# Patient Record
Sex: Female | Born: 1975 | Race: Black or African American | Hispanic: No | Marital: Married | Smoking: Never smoker
Health system: Southern US, Community
[De-identification: ages and names within clinical notes are randomized; demographics above are authoritative.]

## PROBLEM LIST (undated history)

## (undated) DIAGNOSIS — Z789 Other specified health status: Secondary | ICD-10-CM

## (undated) HISTORY — PX: APPENDECTOMY: SHX54

---

## 2002-11-03 ENCOUNTER — Emergency Department (HOSPITAL_COMMUNITY): Admission: EM | Admit: 2002-11-03 | Discharge: 2002-11-03 | Payer: Self-pay | Admitting: Emergency Medicine

## 2002-11-03 ENCOUNTER — Encounter: Payer: Self-pay | Admitting: Emergency Medicine

## 2002-12-25 ENCOUNTER — Other Ambulatory Visit: Admission: RE | Admit: 2002-12-25 | Discharge: 2002-12-25 | Payer: Self-pay | Admitting: Obstetrics and Gynecology

## 2003-09-04 ENCOUNTER — Emergency Department (HOSPITAL_COMMUNITY): Admission: EM | Admit: 2003-09-04 | Discharge: 2003-09-04 | Payer: Self-pay | Admitting: Emergency Medicine

## 2005-02-17 ENCOUNTER — Inpatient Hospital Stay (HOSPITAL_COMMUNITY): Admission: AD | Admit: 2005-02-17 | Discharge: 2005-02-17 | Payer: Self-pay | Admitting: Obstetrics & Gynecology

## 2005-06-01 ENCOUNTER — Other Ambulatory Visit: Admission: RE | Admit: 2005-06-01 | Discharge: 2005-06-01 | Payer: Self-pay | Admitting: Obstetrics and Gynecology

## 2006-09-29 ENCOUNTER — Other Ambulatory Visit: Admission: RE | Admit: 2006-09-29 | Discharge: 2006-09-29 | Payer: Self-pay | Admitting: Obstetrics and Gynecology

## 2006-10-08 ENCOUNTER — Other Ambulatory Visit: Admission: RE | Admit: 2006-10-08 | Discharge: 2006-10-08 | Payer: Self-pay | Admitting: Obstetrics & Gynecology

## 2007-02-25 ENCOUNTER — Emergency Department (HOSPITAL_COMMUNITY): Admission: EM | Admit: 2007-02-25 | Discharge: 2007-02-26 | Payer: Self-pay | Admitting: Emergency Medicine

## 2007-08-02 ENCOUNTER — Emergency Department (HOSPITAL_COMMUNITY): Admission: EM | Admit: 2007-08-02 | Discharge: 2007-08-02 | Payer: Self-pay | Admitting: Family Medicine

## 2007-09-29 ENCOUNTER — Encounter: Admission: RE | Admit: 2007-09-29 | Discharge: 2007-09-29 | Payer: Self-pay | Admitting: Certified Nurse Midwife

## 2007-12-02 ENCOUNTER — Encounter: Admission: RE | Admit: 2007-12-02 | Discharge: 2007-12-06 | Payer: Self-pay | Admitting: Certified Nurse Midwife

## 2010-04-27 ENCOUNTER — Emergency Department (HOSPITAL_BASED_OUTPATIENT_CLINIC_OR_DEPARTMENT_OTHER): Admission: EM | Admit: 2010-04-27 | Discharge: 2010-04-28 | Payer: Self-pay | Admitting: Certified Registered"

## 2010-05-21 ENCOUNTER — Encounter: Admission: RE | Admit: 2010-05-21 | Discharge: 2010-05-21 | Payer: Self-pay | Admitting: Obstetrics and Gynecology

## 2010-11-23 NOTE — L&D Delivery Note (Signed)
Delivery Note At 1:46 PM a viable and healthy female was delivered via Vaginal, Spontaneous Delivery (Presentation: Left Occiput Anterior).  APGAR: , ; weight 6 lb 10 oz (3005 g).   Placenta status: Intact, Spontaneous Pathology for mec.  Cord: 3 vessels with the following complications: None.  Cord pH: none  Anesthesia: Epidural  Episiotomy: None Lacerations: none Suture Repair: none Est. Blood Loss (mL): 150  Mom to postpartum.  Baby to nursery-stable.  Kayci Belleville A 09/28/2011, 2:17 PM

## 2011-02-09 LAB — URINALYSIS, ROUTINE W REFLEX MICROSCOPIC
Bilirubin Urine: NEGATIVE
Glucose, UA: NEGATIVE mg/dL
Hgb urine dipstick: NEGATIVE
Ketones, ur: NEGATIVE mg/dL
Nitrite: NEGATIVE
Protein, ur: NEGATIVE mg/dL
Specific Gravity, Urine: 1.023 (ref 1.005–1.030)
Urobilinogen, UA: 0.2 mg/dL (ref 0.0–1.0)
pH: 8.5 — ABNORMAL HIGH (ref 5.0–8.0)

## 2011-02-09 LAB — URINE MICROSCOPIC-ADD ON

## 2011-02-09 LAB — GLUCOSE, CAPILLARY: Glucose-Capillary: 114 mg/dL — ABNORMAL HIGH (ref 70–99)

## 2011-02-09 LAB — PREGNANCY, URINE: Preg Test, Ur: NEGATIVE

## 2011-09-14 ENCOUNTER — Encounter (HOSPITAL_COMMUNITY): Payer: Self-pay | Admitting: *Deleted

## 2011-09-14 ENCOUNTER — Inpatient Hospital Stay (HOSPITAL_COMMUNITY)
Admission: AD | Admit: 2011-09-14 | Discharge: 2011-09-14 | Disposition: A | Discharge status: Home / Self Care | Payer: 59 | Source: Ambulatory Visit | Attending: Obstetrics & Gynecology | Admitting: Obstetrics & Gynecology

## 2011-09-14 DIAGNOSIS — O479 False labor, unspecified: Secondary | ICD-10-CM | POA: Insufficient documentation

## 2011-09-14 HISTORY — DX: Other specified health status: Z78.9

## 2011-09-14 NOTE — Progress Notes (Signed)
Pt G3 P1, 38.5wks having contractions every 2-50min x 1hr.  Pt denies problems with pregnancy.  No leaking or bleeding.

## 2011-09-28 ENCOUNTER — Inpatient Hospital Stay (HOSPITAL_COMMUNITY): Payer: 59 | Admitting: Anesthesiology

## 2011-09-28 ENCOUNTER — Encounter (HOSPITAL_COMMUNITY): Payer: Self-pay | Admitting: Anesthesiology

## 2011-09-28 ENCOUNTER — Encounter (HOSPITAL_COMMUNITY): Payer: Self-pay | Admitting: *Deleted

## 2011-09-28 ENCOUNTER — Inpatient Hospital Stay (HOSPITAL_COMMUNITY)
Admission: AD | Admit: 2011-09-28 | Discharge: 2011-09-30 | DRG: 775 | Disposition: A | Discharge status: Home / Self Care | Payer: 59 | Source: Ambulatory Visit | Attending: Obstetrics and Gynecology | Admitting: Obstetrics and Gynecology

## 2011-09-28 ENCOUNTER — Other Ambulatory Visit: Payer: Self-pay | Admitting: Obstetrics and Gynecology

## 2011-09-28 DIAGNOSIS — O480 Post-term pregnancy: Principal | ICD-10-CM | POA: Diagnosis present

## 2011-09-28 LAB — CBC
HCT: 34.3 % — ABNORMAL LOW (ref 36.0–46.0)
HCT: 33 % — ABNORMAL LOW (ref 36.0–46.0)
Hemoglobin: 11.2 g/dL — ABNORMAL LOW (ref 12.0–15.0)
Hemoglobin: 10.9 g/dL — ABNORMAL LOW (ref 12.0–15.0)
MCH: 28.6 pg (ref 26.0–34.0)
MCHC: 33 g/dL (ref 30.0–36.0)
MCV: 86.6 fL (ref 78.0–100.0)
RBC: 3.96 MIL/uL (ref 3.87–5.11)
RDW: 15.3 % (ref 11.5–15.5)
RDW: 15.3 % (ref 11.5–15.5)
WBC: 14.5 10*3/uL — ABNORMAL HIGH (ref 4.0–10.5)

## 2011-09-28 LAB — URIC ACID: Uric Acid, Serum: 4.9 mg/dL (ref 2.4–7.0)

## 2011-09-28 LAB — COMPREHENSIVE METABOLIC PANEL
Albumin: 2.3 g/dL — ABNORMAL LOW (ref 3.5–5.2)
BUN: 10 mg/dL (ref 6–23)
Creatinine, Ser: 0.83 mg/dL (ref 0.50–1.10)
GFR calc Af Amer: >90 mL/min (ref >90)
Glucose, Bld: 182 mg/dL — ABNORMAL HIGH (ref 70–99)
Total Protein: 5.4 g/dL — ABNORMAL LOW (ref 6.0–8.3)

## 2011-09-28 LAB — STREP B DNA PROBE: GBS: NEGATIVE

## 2011-09-28 LAB — ANTIBODY SCREEN: Antibody Screen: NEGATIVE

## 2011-09-28 LAB — RPR: RPR: NONREACTIVE

## 2011-09-28 MED ORDER — OXYTOCIN 10 UNIT/ML IJ SOLN: 10.0000 [IU] | Freq: Once | INTRAMUSCULAR | Status: DC

## 2011-09-28 MED ORDER — CITRIC ACID-SODIUM CITRATE 334-500 MG/5ML PO SOLN: 30.0000 mL | ORAL | Status: DC | PRN

## 2011-09-28 MED ORDER — EPHEDRINE 5 MG/ML INJ: 10.0000 mg | INTRAVENOUS | Status: DC | PRN

## 2011-09-28 MED ORDER — NALBUPHINE SYRINGE 5 MG/0.5 ML
10.0000 mg | INJECTION | INTRAMUSCULAR | Status: DC | PRN
  Filled 2011-09-28: qty 1
  Filled 2011-09-28: qty 0.5

## 2011-09-28 MED ORDER — FERROUS SULFATE 325 (65 FE) MG PO TABS
325.0000 mg | ORAL_TABLET | Freq: Two times a day (BID) | ORAL | Status: DC
  Administered 2011-09-28 – 2011-09-30 (×4): 325 mg via ORAL
  Filled 2011-09-28 (×4): qty 1

## 2011-09-28 MED ORDER — FENTANYL 2.5 MCG/ML BUPIVACAINE 1/10 % EPIDURAL INFUSION (WH - ANES)
INTRAMUSCULAR | Status: DC | PRN
  Administered 2011-09-28: 12 mL/h via EPIDURAL

## 2011-09-28 MED ORDER — LIDOCAINE HCL (PF) 1 % IJ SOLN: 30.0000 mL | INTRAMUSCULAR | Status: DC | PRN

## 2011-09-28 MED ORDER — BENZOCAINE-MENTHOL 20-0.5 % EX AERO: 1.0000 "application " | INHALATION_SPRAY | CUTANEOUS | Status: DC | PRN

## 2011-09-28 MED ORDER — IBUPROFEN 600 MG PO TABS: 600.0000 mg | ORAL_TABLET | Freq: Four times a day (QID) | ORAL | Status: DC | PRN

## 2011-09-28 MED ORDER — LACTATED RINGERS IV SOLN: 500.0000 mL | INTRAVENOUS | Status: DC | PRN

## 2011-09-28 MED ORDER — IBUPROFEN 600 MG PO TABS
600.0000 mg | ORAL_TABLET | Freq: Four times a day (QID) | ORAL | Status: DC
  Administered 2011-09-28 – 2011-09-30 (×8): 600 mg via ORAL
  Filled 2011-09-28 (×8): qty 1

## 2011-09-28 MED ORDER — DIBUCAINE 1 % RE OINT: 1.0000 "application " | TOPICAL_OINTMENT | RECTAL | Status: DC | PRN

## 2011-09-28 MED ORDER — LACTATED RINGERS IV SOLN: 500.0000 mL | Freq: Once | INTRAVENOUS | Status: DC

## 2011-09-28 MED ORDER — ONDANSETRON HCL 4 MG/2ML IJ SOLN: 4.0000 mg | INTRAMUSCULAR | Status: DC | PRN

## 2011-09-28 MED ORDER — PHENYLEPHRINE 40 MCG/ML (10ML) SYRINGE FOR IV PUSH (FOR BLOOD PRESSURE SUPPORT)
80.0000 ug | PREFILLED_SYRINGE | INTRAVENOUS | Status: DC | PRN
  Filled 2011-09-28: qty 5

## 2011-09-28 MED ORDER — NALBUPHINE SYRINGE 5 MG/0.5 ML
5.0000 mg | INJECTION | INTRAMUSCULAR | Status: DC | PRN
  Administered 2011-09-28: 5 mg via INTRAVENOUS

## 2011-09-28 MED ORDER — SIMETHICONE 80 MG PO CHEW: 80.0000 mg | CHEWABLE_TABLET | ORAL | Status: DC | PRN

## 2011-09-28 MED ORDER — SODIUM BICARBONATE 8.4 % IV SOLN
INTRAVENOUS | Status: DC | PRN
  Administered 2011-09-28: 4 mL via EPIDURAL

## 2011-09-28 MED ORDER — LACTATED RINGERS IV SOLN
INTRAVENOUS | Status: DC
  Administered 2011-09-28 (×2): via INTRAVENOUS

## 2011-09-28 MED ORDER — ONDANSETRON HCL 4 MG/2ML IJ SOLN: 4.0000 mg | Freq: Four times a day (QID) | INTRAMUSCULAR | Status: DC | PRN

## 2011-09-28 MED ORDER — TERBUTALINE SULFATE 1 MG/ML IJ SOLN: 0.2500 mg | Freq: Once | INTRAMUSCULAR | Status: DC | PRN

## 2011-09-28 MED ORDER — ONDANSETRON HCL 4 MG PO TABS: 4.0000 mg | ORAL_TABLET | ORAL | Status: DC | PRN

## 2011-09-28 MED ORDER — OXYTOCIN 20 UNITS IN LACTATED RINGERS INFUSION - SIMPLE
1.0000 m[IU]/min | INTRAVENOUS | Status: DC
  Administered 2011-09-28: 6 m[IU]/min via INTRAVENOUS
  Administered 2011-09-28: 333 m[IU]/min via INTRAVENOUS
  Administered 2011-09-28: 2 m[IU]/min via INTRAVENOUS
  Filled 2011-09-28: qty 1000

## 2011-09-28 MED ORDER — ACETAMINOPHEN 325 MG PO TABS: 650.0000 mg | ORAL_TABLET | ORAL | Status: DC | PRN

## 2011-09-28 MED ORDER — DIPHENHYDRAMINE HCL 25 MG PO CAPS: 25.0000 mg | ORAL_CAPSULE | Freq: Four times a day (QID) | ORAL | Status: DC | PRN

## 2011-09-28 MED ORDER — WITCH HAZEL-GLYCERIN EX PADS: 1.0000 "application " | MEDICATED_PAD | CUTANEOUS | Status: DC | PRN

## 2011-09-28 MED ORDER — EPHEDRINE 5 MG/ML INJ
10.0000 mg | INTRAVENOUS | Status: DC | PRN
  Filled 2011-09-28: qty 4

## 2011-09-28 MED ORDER — OXYCODONE-ACETAMINOPHEN 5-325 MG PO TABS: 2.0000 | ORAL_TABLET | ORAL | Status: DC | PRN

## 2011-09-28 MED ORDER — SENNOSIDES-DOCUSATE SODIUM 8.6-50 MG PO TABS
2.0000 | ORAL_TABLET | Freq: Every day | ORAL | Status: DC
  Administered 2011-09-28 – 2011-09-29 (×2): 2 via ORAL

## 2011-09-28 MED ORDER — LACTATED RINGERS IV SOLN
INTRAVENOUS | Status: DC
  Administered 2011-09-28: 11:00:00 via INTRAUTERINE

## 2011-09-28 MED ORDER — TETANUS-DIPHTH-ACELL PERTUSSIS 5-2.5-18.5 LF-MCG/0.5 IM SUSP
0.5000 mL | Freq: Once | INTRAMUSCULAR | Status: AC
  Administered 2011-09-29: 0.5 mL via INTRAMUSCULAR
  Filled 2011-09-28: qty 0.5

## 2011-09-28 MED ORDER — FLEET ENEMA 7-19 GM/118ML RE ENEM: 1.0000 | ENEMA | RECTAL | Status: DC | PRN

## 2011-09-28 MED ORDER — LACTATED RINGERS IV SOLN
INTRAVENOUS | Status: DC
  Administered 2011-09-28: 07:00:00 via INTRAVENOUS

## 2011-09-28 MED ORDER — OXYCODONE-ACETAMINOPHEN 5-325 MG PO TABS
1.0000 | ORAL_TABLET | ORAL | Status: DC | PRN
  Administered 2011-09-29: 1 via ORAL
  Administered 2011-09-30: 2 via ORAL
  Filled 2011-09-28 (×2): qty 1

## 2011-09-28 MED ORDER — LIDOCAINE HCL 1.5 % IJ SOLN
INTRAMUSCULAR | Status: DC | PRN
  Administered 2011-09-28: 4 mL via EPIDURAL
  Administered 2011-09-28: 33 mL via EPIDURAL

## 2011-09-28 MED ORDER — PRENATAL PLUS 27-1 MG PO TABS
1.0000 | ORAL_TABLET | Freq: Every day | ORAL | Status: DC
  Administered 2011-09-28 – 2011-09-30 (×3): 1 via ORAL
  Filled 2011-09-28 (×3): qty 1

## 2011-09-28 MED ORDER — DIPHENHYDRAMINE HCL 50 MG/ML IJ SOLN: 12.5000 mg | INTRAMUSCULAR | Status: DC | PRN

## 2011-09-28 MED ORDER — OXYTOCIN 20 UNITS IN LACTATED RINGERS INFUSION - SIMPLE: 125.0000 mL/h | Freq: Once | INTRAVENOUS | Status: DC

## 2011-09-28 MED ORDER — OXYTOCIN BOLUS FROM INFUSION
500.0000 mL | Freq: Once | INTRAVENOUS | Status: DC
  Filled 2011-09-28: qty 500

## 2011-09-28 MED ORDER — FENTANYL 2.5 MCG/ML BUPIVACAINE 1/10 % EPIDURAL INFUSION (WH - ANES)
14.0000 mL/h | INTRAMUSCULAR | Status: DC
  Filled 2011-09-28: qty 60

## 2011-09-28 MED ORDER — LANOLIN HYDROUS EX OINT: TOPICAL_OINTMENT | CUTANEOUS | Status: DC | PRN

## 2011-09-28 MED ORDER — ZOLPIDEM TARTRATE 5 MG PO TABS: 5.0000 mg | ORAL_TABLET | Freq: Every evening | ORAL | Status: DC | PRN

## 2011-09-28 MED ORDER — PHENYLEPHRINE 40 MCG/ML (10ML) SYRINGE FOR IV PUSH (FOR BLOOD PRESSURE SUPPORT): 80.0000 ug | PREFILLED_SYRINGE | INTRAVENOUS | Status: DC | PRN

## 2011-09-28 NOTE — Progress Notes (Signed)
Kristen Gonzalez is a 35 y.o. G3P0011 at [redacted]w[redacted]d by ultrasound admitted for active labor  Subjective: Chief Complaint  Patient presents with  . Contractions    Objective: BP 109/54  Pulse 70  Temp(Src) 97.5 F (36.4 C) (Oral)  Resp 18  Ht 4\' 11"  (1.499 m)  Wt 73.483 kg (162 lb)  BMI 32.72 kg/m2     Pitocin 2 MIU  FHT:  tracingvariable decels UC:   regular, every 2-3 minutes SVE:   5/70/-3 SROM med mec fluid Tracing: variable decels, baseline 110-115 min variability ctx q 2- 3 mins  Labs: Lab Results  Component Value Date   WBC 14.5* 09/28/2011   HGB 11.2* 09/28/2011   HCT 34.3* 09/28/2011   MCV 86.6 09/28/2011   PLT 180 09/28/2011    Assessment / Plan: Augmentation of labor, progressing well  P0 Epidural. Amnioinfusion cont pitocin Anticipated MOD:  NSVD  Kristen Gonzalez A 09/28/2011, 10:32 AM

## 2011-09-28 NOTE — Progress Notes (Signed)
Pt reports painful uc's since 03:30. Pt reports vaginal bleeding at 06:00.

## 2011-09-28 NOTE — Progress Notes (Signed)
S: Called for delivery w/ FHR deceleration.     O: on arrival, mat O2  In place. FHR back to baseline of 110-115. FHR had decreased to 60's   X 5 mins with / variables.    VE:  Fully dilated/ +2 station  Thick mec despite amnioinfusion  IMP: Complete Postterm P) Pedi in attendance 2) start pushing

## 2011-09-28 NOTE — Anesthesia Preprocedure Evaluation (Signed)
Anesthesia Evaluation  Patient identified by MRN, date of birth, ID band Patient awake    Reviewed: Allergy & Precautions, H&P , Patient's Chart, lab work & pertinent test results  Airway Mallampati: III TM Distance: >3 FB Neck ROM: full    Dental No notable dental hx. (+) Teeth Intact   Pulmonary neg pulmonary ROS,  clear to auscultation  Pulmonary exam normal       Cardiovascular neg cardio ROS regular Normal    Neuro/Psych Negative Neurological ROS  Negative Psych ROS   GI/Hepatic negative GI ROS, Neg liver ROS,   Endo/Other  Negative Endocrine ROS  Renal/GU negative Renal ROS  Genitourinary negative   Musculoskeletal   Abdominal   Peds  Hematology negative hematology ROS (+)   Anesthesia Other Findings   Reproductive/Obstetrics (+) Pregnancy                           Anesthesia Physical Anesthesia Plan  ASA: II  Anesthesia Plan: Epidural   Post-op Pain Management:    Induction:   Airway Management Planned:   Additional Equipment:   Intra-op Plan:   Post-operative Plan:   Informed Consent: I have reviewed the patients History and Physical, chart, labs and discussed the procedure including the risks, benefits and alternatives for the proposed anesthesia with the patient or authorized representative who has indicated his/her understanding and acceptance.     Plan Discussed with: Anesthesiologist and Surgeon  Anesthesia Plan Comments:         Anesthesia Quick Evaluation  

## 2011-09-28 NOTE — Anesthesia Procedure Notes (Signed)
Epidural Patient location during procedure: OB Start time: 09/28/2011 11:17 AM  Staffing Anesthesiologist: Leshawn Straka A. Performed by: anesthesiologist   Preanesthetic Checklist Completed: patient identified, site marked, surgical consent, pre-op evaluation, timeout performed, IV checked, risks and benefits discussed and monitors and equipment checked  Epidural Patient position: sitting Prep: site prepped and draped and DuraPrep Patient monitoring: continuous pulse ox and blood pressure Approach: midline Injection technique: LOR air  Needle:  Needle type: Tuohy  Needle gauge: 17 G Needle length: 9 cm Needle insertion depth: 7 cm Catheter type: closed end flexible Catheter size: 19 Gauge Catheter at skin depth: 12 cm Test dose: negative and 1.5% lidocaine  Assessment Events: blood not aspirated, injection not painful, no injection resistance, negative IV test and no paresthesia  Additional Notes Patient is more comfortable after epidural dosed. Please see RN's note for documentation of vital signs and FHR which are stable.

## 2011-09-28 NOTE — H&P (Signed)
Alvis Mort Sawyers is a 35 y.o. female presenting for active labor History OB History    Grav Para Term Preterm Abortions TAB SAB Ect Mult Living   3 1   1  1   1      Past Medical History  Diagnosis Date  . No pertinent past medical history    Past Surgical History  Procedure Date  . Appendectomy    Family History: family history includes Diabetes in her mother and Hypertension in her mother. Social History:  reports that she has never smoked. She has never used smokeless tobacco. She reports that she does not drink alcohol or use illicit drugs.  ROS neg PE: Uncomfortable gravid female Skin: warm dry Heent: anicteric sclera, pink conjunctiva, oropharynx neg Cor: RRR w/o m Lungs: clear to A Abd gravid Ext 1+ edema Dilation: 4.5 Effacement (%): 70 Station: -2 Exam by:: B Mosca Blood pressure 112/67, pulse 74, temperature 98.2 F (36.8 C), temperature source Oral, resp. rate 20. Exam Physical Exam  Prenatal labs: ABO, Rh:  A positive Antibody:  neg Rubella:  Immune RPR:   NR HBsAg:   neg HIV:   neg GBS:   neg  Assessment/Plan: Active labor P) admit routine labs, amniotomy prn, epidural prn, analgesic prn   Angie Hogg A 09/28/2011, 7:21 AM

## 2011-09-29 LAB — CBC
HCT: 29.5 % — ABNORMAL LOW (ref 36.0–46.0)
Hemoglobin: 9.9 g/dL — ABNORMAL LOW (ref 12.0–15.0)
MCH: 29.1 pg (ref 26.0–34.0)
MCV: 86.8 fL (ref 78.0–100.0)
RBC: 3.4 MIL/uL — ABNORMAL LOW (ref 3.87–5.11)
WBC: 20.2 10*3/uL — ABNORMAL HIGH (ref 4.0–10.5)

## 2011-09-29 MED ORDER — POLYSACCHARIDE IRON 150 MG PO CAPS
150.0000 mg | ORAL_CAPSULE | Freq: Every day | ORAL | Status: DC
Start: 2011-09-29 — End: 2011-09-30
  Administered 2011-09-29: 150 mg via ORAL
  Filled 2011-09-29 (×3): qty 1

## 2011-09-29 NOTE — Anesthesia Postprocedure Evaluation (Signed)
  Anesthesia Post-op Note  Patient: [employer]  Procedure(s) Performed: * No procedures listed *  Patient Location: Mother/Baby  Anesthesia Type: Epidural  Level of Consciousness: awake, alert  and oriented  Airway and Oxygen Therapy: Patient Spontanous Breathing  Post-op Pain: none  Post-op Assessment: Post-op Vital signs reviewed, Patient's Cardiovascular Status Stable, Respiratory Function Stable, Patent Airway, No signs of Nausea or vomiting, Adequate PO intake and Pain level controlled  Post-op Vital Signs: Reviewed and stable  Complications: No apparent anesthesia complications

## 2011-09-29 NOTE — Progress Notes (Signed)
  PPD # 1  Subjective: Pt reports feeling well/ Pain controlled with prescription NSAID's including motrin Tolerating po/ Voiding without problems/ No n/v Bleeding is light Newborn info:  Information for the patient's newborn:  Jozalyn, Baglio [[id number]]  female  / circ complete per Dr [name]/ Feeding: bottle    Objective:  VS: Blood pressure 99/54, pulse 88, temperature 97.7 F (36.5 C), temperature source Oral, resp. rate 18, height 4\' 11"  (1.499 m), weight 73.483 kg (162 lb), SpO2 98.00%, unknown if currently breastfeeding.    Basename 09/29/11 0530 09/28/11 1814  WBC 20.2* 21.1*  HGB 9.9* 10.9*  HCT 29.5* 33.0*  PLT 164 188    Blood type: A/Positive/-- (11/05 0000) Rubella: Immune (11/05 0000)    Physical Exam:  General: alert, cooperative and no distress CV: Regular rate and rhythm Resp: clear Abdomen: soft, nontender, normal bowel sounds Uterine Fundus: firm, below umbilicus, nontender Perineum: is normal Lochia: minimal Ext: Homans sign is negative, no sign of DVT and no edema, redness or tenderness in the calves or thighs   A/P: PPD # 1/ [id number] S/P SVD Mild ABL anemia, will add iron supplement after 1st BM Doing well Continue routine post partum orders Anticipate discharge home in AM

## 2011-09-30 MED ORDER — OXYCODONE-ACETAMINOPHEN 5-325 MG PO TABS: 1.0000 | ORAL_TABLET | ORAL | Status: AC | PRN

## 2011-09-30 MED ORDER — FERROUS SULFATE 325 (65 FE) MG PO TABS: 325.0000 mg | ORAL_TABLET | Freq: Two times a day (BID) | ORAL | Status: DC

## 2011-09-30 MED ORDER — IBUPROFEN 600 MG PO TABS: 600.0000 mg | ORAL_TABLET | Freq: Four times a day (QID) | ORAL | Status: AC

## 2011-09-30 NOTE — Progress Notes (Signed)
  PPD 2 SVD  S:  Reports feeling well /just tired             Tolerating po/ No nausea or vomiting             Bleeding is moderate             Pain controlled withprescription NSAID's including motrin and percocet             Up ad lib / ambulatory  Newborn breast feeding  / Circumcision requested   O:  A & O x 3              VS: Blood pressure 100/59, pulse 85, temperature 98.2 F (36.8 C), temperature source Oral, resp. rate 16, height 4\' 11"  (1.499 m), weight 73.483 kg (162 lb), SpO2 98.00%, unknown if currently breastfeeding.  LABS: Lab Results  Component Value Date   WBC 20.2* 09/29/2011   HGB 9.9* 09/29/2011   HCT 29.5* 09/29/2011   MCV 86.8 09/29/2011   PLT 164 09/29/2011     Lungs: Clear and unlabored  Heart: regular rate and rhythm / no mumurs  Abdomen: soft, non-tender, non-distended              Fundus: firm, non-tender, Ueven  Perineum: mild edema - ice pack in place  Lochia: light  Extremities: trace edema, no calf pain or tenderness    A: PPD # 2   Doing well - stable status  P:  Routine post partum orders  Discharge to home  [facility] 09/30/2011, 11:16 AM

## 2011-09-30 NOTE — Discharge Summary (Signed)
Obstetric Discharge Summary Reason for Admission: onset of labor Prenatal Procedures: none Intrapartum Procedures: spontaneous vaginal delivery Postpartum Procedures: none Complications-Operative and Postpartum: none Hemoglobin  Date Value Range Status  09/29/2011 9.9* 12.0-15.0 (g/dL) Final     HCT  Date Value Range Status  09/29/2011 29.5* 36.0-46.0 (%) Final    Discharge Diagnoses: Term Pregnancy-delivered  Discharge Information: Date: 09/30/2011 Activity: pelvic rest Diet: routine Medications: PNV, Ibuprofen, Colace, Iron and Percocet Condition: stable Instructions: refer to practice specific booklet Discharge to: home Follow-up Information    Follow up with [name],[name], MD. Make an appointment in 6 weeks.   Contact information:   [address] [place] [address] [phone]          Newborn Data: Live born female  Birth Weight: 6 lb 10 oz (3005 g) APGAR: 8, 9  Home with mother.  Kristen Gonzalez 09/30/2011, 11:21 AM

## 2012-05-24 ENCOUNTER — Emergency Department (HOSPITAL_COMMUNITY)
Admission: EM | Admit: 2012-05-24 | Discharge: 2012-05-25 | Disposition: A | Discharge status: Home / Self Care | Payer: No Typology Code available for payment source | Attending: Emergency Medicine | Admitting: Emergency Medicine

## 2012-05-24 ENCOUNTER — Encounter (HOSPITAL_COMMUNITY): Payer: Self-pay | Admitting: Radiology

## 2012-05-24 ENCOUNTER — Emergency Department (HOSPITAL_COMMUNITY): Payer: No Typology Code available for payment source

## 2012-05-24 DIAGNOSIS — M25569 Pain in unspecified knee: Secondary | ICD-10-CM | POA: Insufficient documentation

## 2012-05-24 DIAGNOSIS — M545 Low back pain, unspecified: Secondary | ICD-10-CM | POA: Insufficient documentation

## 2012-05-24 DIAGNOSIS — M546 Pain in thoracic spine: Secondary | ICD-10-CM | POA: Insufficient documentation

## 2012-05-24 DIAGNOSIS — IMO0001 Reserved for inherently not codable concepts without codable children: Secondary | ICD-10-CM | POA: Insufficient documentation

## 2012-05-24 DIAGNOSIS — R079 Chest pain, unspecified: Secondary | ICD-10-CM | POA: Insufficient documentation

## 2012-05-24 DIAGNOSIS — M542 Cervicalgia: Secondary | ICD-10-CM | POA: Insufficient documentation

## 2012-05-24 LAB — URINALYSIS, ROUTINE W REFLEX MICROSCOPIC
Ketones, ur: NEGATIVE mg/dL
Nitrite: NEGATIVE
Protein, ur: 30 mg/dL — AB
Urobilinogen, UA: 0.2 mg/dL (ref 0.0–1.0)

## 2012-05-24 LAB — POCT I-STAT, CHEM 8
BUN: 14 mg/dL (ref 6–23)
Calcium, Ion: 1.19 mmol/L (ref 1.12–1.32)
Hemoglobin: 14.6 g/dL (ref 12.0–15.0)
Sodium: 140 mEq/L (ref 135–145)
TCO2: 25 mmol/L (ref 0–100)

## 2012-05-24 LAB — PREGNANCY, URINE: Preg Test, Ur: NEGATIVE

## 2012-05-24 LAB — URINE MICROSCOPIC-ADD ON

## 2012-05-24 MED ORDER — IBUPROFEN 800 MG PO TABS: 800.0000 mg | ORAL_TABLET | Freq: Three times a day (TID) | ORAL | Status: AC

## 2012-05-24 MED ORDER — DIAZEPAM 5 MG PO TABS: 5.0000 mg | ORAL_TABLET | Freq: Three times a day (TID) | ORAL | Status: AC | PRN

## 2012-05-24 MED ORDER — DIAZEPAM 5 MG PO TABS
5.0000 mg | ORAL_TABLET | Freq: Once | ORAL | Status: AC
  Administered 2012-05-24: 5 mg via ORAL
  Filled 2012-05-24: qty 1

## 2012-05-24 MED ORDER — OXYCODONE-ACETAMINOPHEN 5-325 MG PO TABS
1.0000 | ORAL_TABLET | Freq: Once | ORAL | Status: AC
  Administered 2012-05-24: 1 via ORAL
  Filled 2012-05-24: qty 1

## 2012-05-24 MED ORDER — OXYCODONE-ACETAMINOPHEN 5-325 MG PO TABS: ORAL_TABLET | ORAL | Status: DC

## 2012-05-24 MED ORDER — IBUPROFEN 200 MG PO TABS
600.0000 mg | ORAL_TABLET | Freq: Once | ORAL | Status: AC
  Administered 2012-05-24: 600 mg via ORAL
  Filled 2012-05-24: qty 3
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
## 2012-05-24 NOTE — ED Notes (Signed)
Pt ambulated with a steady gait; VSS; A&Ox3; no signs of distress; respirations even and unlabored; skin warm and dry. No questions at time.
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
## 2012-05-25 ENCOUNTER — Encounter (HOSPITAL_COMMUNITY): Payer: Self-pay

## 2012-05-25 ENCOUNTER — Emergency Department (HOSPITAL_COMMUNITY)
Admission: EM | Admit: 2012-05-25 | Discharge: 2012-05-25 | Disposition: A | Discharge status: Home / Self Care | Payer: No Typology Code available for payment source | Attending: Emergency Medicine | Admitting: Emergency Medicine

## 2012-05-25 DIAGNOSIS — M7918 Myalgia, other site: Secondary | ICD-10-CM

## 2012-05-25 DIAGNOSIS — Z043 Encounter for examination and observation following other accident: Secondary | ICD-10-CM | POA: Insufficient documentation

## 2012-05-25 DIAGNOSIS — IMO0001 Reserved for inherently not codable concepts without codable children: Secondary | ICD-10-CM | POA: Insufficient documentation

## 2012-05-25 MED ORDER — DIAZEPAM 5 MG/ML IJ SOLN
2.5000 mg | Freq: Once | INTRAMUSCULAR | Status: AC
  Administered 2012-05-25: 2.5 mg via INTRAMUSCULAR
  Filled 2012-05-25: qty 2

## 2012-05-25 MED ORDER — MORPHINE SULFATE 4 MG/ML IJ SOLN
4.0000 mg | Freq: Once | INTRAMUSCULAR | Status: AC
  Administered 2012-05-25: 4 mg via INTRAMUSCULAR
  Filled 2012-05-25: qty 1
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
## 2012-05-25 NOTE — Progress Notes (Signed)
Orthopedic Tech Progress Note Patient Details:  Kristen Gonzalez [facility] 1976/07/12 [id number]  Ortho Devices Type of Ortho Device: Crutches Ortho Device/Splint Interventions: Application   [name] T 05/25/2012, 4:08 PM

## 2012-05-25 NOTE — ED Provider Notes (Signed)
Medical screening examination/treatment/procedure(s) were performed by non-physician practitioner and as supervising physician I was immediately available for consultation/collaboration.  Juliet Rude. Rubin Payor, MD 05/25/12 1610

## 2012-05-25 NOTE — ED Notes (Signed)
Ortho at bedside.
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
## 2012-05-25 NOTE — Discharge Instructions (Signed)
Take your previously prescribed pain medication. Follow up with your primary care doctor as discussed.  RESOURCE GUIDE  Dental Problems  Patients with Medicaid: [facility] [phone]                                           [phone] [address] [employer] Phone:  [phone]                                                   Phone:  [phone]  If unable to pay or uninsured, contact:  [facility] or [facility]. to become qualified for the adult dental clinic.  Chronic Pain Problems Contact [facility]  [phone] Patients need to be referred by their primary care doctor.  Insufficient Money for Medicine Contact [employer]:  call "211" or [facility] [phone].  No Primary Care Doctor Call [facility]  [phone] Other agencies that provide inexpensive medical care    [facility]  [phone]    [facility]  [phone]    [facility]  [phone]    [facility]  [phone]    [facility]  [phone]    [facility]  [phone]  Psychological Services [facility] Behavioral [facility]  [phone] [facility]  [phone] [facility]   [phone] (emergency services [phone])  Abuse/Neglect [facility] Child Abuse Hotline [phone] [facility] Child Abuse Hotline 716-082-4039 (After Hours)  Emergency Shelter [facility] [phone]  M[facility] [place] [facility] [place] [phone] [facility] [phone]  MRSA Hotline #:   [phone]    [facility] Resources  [facility]  [employer]                           [facility]. [address] [place]                     [address]         [address]  Blondell Reveal Phone:  [phone]                                  Phone:  [phone]                   Phone:  [phone]  [facility] Mental Health Phone:  [phone]  [facility] Child Abuse Hotline [phone] (After  Hours)
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
## 2014-05-15 ENCOUNTER — Encounter (HOSPITAL_BASED_OUTPATIENT_CLINIC_OR_DEPARTMENT_OTHER): Payer: Self-pay | Admitting: Emergency Medicine

## 2014-05-15 ENCOUNTER — Emergency Department (HOSPITAL_BASED_OUTPATIENT_CLINIC_OR_DEPARTMENT_OTHER)
Admission: EM | Admit: 2014-05-15 | Discharge: 2014-05-16 | Disposition: A | Discharge status: Home / Self Care | Payer: 59 | Attending: Emergency Medicine | Admitting: Emergency Medicine

## 2014-05-15 ENCOUNTER — Emergency Department (HOSPITAL_BASED_OUTPATIENT_CLINIC_OR_DEPARTMENT_OTHER): Payer: 59

## 2014-05-15 DIAGNOSIS — R209 Unspecified disturbances of skin sensation: Secondary | ICD-10-CM | POA: Insufficient documentation

## 2014-05-15 DIAGNOSIS — J189 Pneumonia, unspecified organism: Secondary | ICD-10-CM

## 2014-05-15 DIAGNOSIS — R000 Tachycardia, unspecified: Secondary | ICD-10-CM | POA: Insufficient documentation

## 2014-05-15 DIAGNOSIS — Z792 Long term (current) use of antibiotics: Secondary | ICD-10-CM | POA: Insufficient documentation

## 2014-05-15 DIAGNOSIS — J159 Unspecified bacterial pneumonia: Secondary | ICD-10-CM | POA: Insufficient documentation

## 2014-05-15 LAB — CBC WITH DIFFERENTIAL/PLATELET
BASOS ABS: 0 10*3/uL (ref 0.0–0.1)
BASOS PCT: 0 % (ref 0–1)
EOS ABS: 0.2 10*3/uL (ref 0.0–0.7)
EOS PCT: 1 % (ref 0–5)
HCT: 42.3 % (ref 36.0–46.0)
Hemoglobin: 14.2 g/dL (ref 12.0–15.0)
Lymphocytes Relative: 13 % (ref 12–46)
Lymphs Abs: 2.4 10*3/uL (ref 0.7–4.0)
MCH: 29.5 pg (ref 26.0–34.0)
MCHC: 33.6 g/dL (ref 30.0–36.0)
MCV: 87.8 fL (ref 78.0–100.0)
Monocytes Absolute: 1.5 10*3/uL — ABNORMAL HIGH (ref 0.1–1.0)
Monocytes Relative: 8 % (ref 3–12)
Neutro Abs: 13.8 10*3/uL — ABNORMAL HIGH (ref 1.7–7.7)
Neutrophils Relative %: 77 % (ref 43–77)
PLATELETS: 245 10*3/uL (ref 150–400)
RBC: 4.82 MIL/uL (ref 3.87–5.11)
RDW: 13.8 % (ref 11.5–15.5)
WBC: 17.9 10*3/uL — ABNORMAL HIGH (ref 4.0–10.5)

## 2014-05-15 LAB — COMPREHENSIVE METABOLIC PANEL
ALBUMIN: 4.4 g/dL (ref 3.5–5.2)
ALT: 19 U/L (ref 0–35)
AST: 19 U/L (ref 0–37)
Alkaline Phosphatase: 64 U/L (ref 39–117)
BUN: 8 mg/dL (ref 6–23)
CALCIUM: 9.5 mg/dL (ref 8.4–10.5)
CO2: 25 mEq/L (ref 19–32)
Chloride: 98 mEq/L (ref 96–112)
Creatinine, Ser: 0.9 mg/dL (ref 0.50–1.10)
GFR calc non Af Amer: 81 mL/min — ABNORMAL LOW (ref >90)
Glucose, Bld: 108 mg/dL — ABNORMAL HIGH (ref 70–99)
Potassium: 4.1 mEq/L (ref 3.7–5.3)
SODIUM: 137 meq/L (ref 137–147)
TOTAL PROTEIN: 7.9 g/dL (ref 6.0–8.3)
Total Bilirubin: 0.4 mg/dL (ref 0.3–1.2)

## 2014-05-15 MED ORDER — ACETAMINOPHEN 325 MG PO TABS
650.0000 mg | ORAL_TABLET | Freq: Once | ORAL | Status: AC
  Administered 2014-05-15: 650 mg via ORAL

## 2014-05-15 MED ORDER — LEVOFLOXACIN IN D5W 500 MG/100ML IV SOLN
500.0000 mg | Freq: Once | INTRAVENOUS | Status: AC
  Administered 2014-05-15: 500 mg via INTRAVENOUS
  Filled 2014-05-15: qty 100

## 2014-05-15 MED ORDER — SODIUM CHLORIDE 0.9 % IV BOLUS (SEPSIS)
1000.0000 mL | Freq: Once | INTRAVENOUS | Status: AC
  Administered 2014-05-15: 1000 mL via INTRAVENOUS

## 2014-05-15 MED ORDER — PROMETHAZINE-DM 6.25-15 MG/5ML PO SYRP: 2.5000 mL | ORAL_SOLUTION | Freq: Four times a day (QID) | ORAL | Status: DC | PRN

## 2014-05-15 MED ORDER — LEVOFLOXACIN 500 MG PO TABS: 500.0000 mg | ORAL_TABLET | Freq: Every day | ORAL | Status: AC

## 2014-05-15 NOTE — ED Notes (Addendum)
Fever. Chest pain and sob x 5 hours. Upper respiratory congestion. She is speaking in complete sentences. She states she had 2 teeth pulled on Friday. She has been taking Amoxicillin since that time.

## 2014-05-15 NOTE — ED Provider Notes (Signed)
CSN: [id number]     Arrival date & time 05/15/14  1906 History  This chart was scribed for Gerhard Munchobert Lockwood, MD by Phillis HaggisGabriella Gaje, ED Scribe. This patient was seen in room MH01/MH01 and patient care was started at 7:48 PM.   Chief Complaint  Patient presents with  . Shortness of Breath   The history is provided by the patient. No language interpreter was used.  HPI Comments: Kristen Gonzalez is a 38 y.o. female who presents to the Emergency Department complaining of gradual onset, constant, mild to moderate chest pain onset 5 hours ago. There is associated chills, SOB and a mild cough. She also reports associated numbness in upper and lower extremities. She reports a new onset of nasal congestion and states that her son was recently diagnosed with pneumonia. She had two teeth pulled 4 days ago and has been taking amoxicillin and ibuprofen. She denies a history of similar problems and any other medical history. She reports that she does not smoke or drink, has not engaged in any new activities.   Past Medical History  Diagnosis Date  . No pertinent past medical history    Past Surgical History  Procedure Laterality Date  . Appendectomy     Family History  Problem Relation Age of Onset  . Hypertension Mother   . Diabetes Mother    History  Substance Use Topics  . Smoking status: Never Smoker   . Smokeless tobacco: Never Used  . Alcohol Use: No   OB History   Grav Para Term Preterm Abortions TAB SAB Ect Mult Living   3 2 1  1  1   2      Review of Systems  Constitutional:       Per HPI, otherwise negative  HENT: Positive for congestion.        Per HPI, otherwise negative  Respiratory:       Per HPI, otherwise negative  Cardiovascular: Positive for chest pain.       Per HPI, otherwise negative  Gastrointestinal: Negative for vomiting.  Endocrine:       Negative aside from HPI  Genitourinary:       Neg aside from HPI   Musculoskeletal:       Per HPI, otherwise negative   Skin: Negative.   Neurological: Positive for numbness. Negative for syncope.      Allergies  Review of patient's allergies indicates no known allergies.  Home Medications   Prior to Admission medications   Medication Sig Start Date End Date Taking? Authorizing Provider  AMOXICILLIN PO Take by mouth.   Yes Historical Provider, MD  Hydrocodone-Acetaminophen (VICODIN PO) Take by mouth.   Yes Historical Provider, MD  IBUPROFEN PO Take by mouth.   Yes Historical Provider, MD  oxyCODONE-acetaminophen (PERCOCET) 5-325 MG per tablet Take 1-2 tablets by mouth every 6 (six) hours as needed. For pain    Historical Provider, MD  prenatal vitamin w/FE, FA (PRENATAL 1 + 1) 27-1 MG TABS Take 1 tablet by mouth daily.      Historical Provider, MD   BP 114/69  Pulse 128  Temp(Src) 103.1 F (39.5 C) (Oral)  Resp 22  Ht 4\' 11"  (1.499 m)  Wt 161 lb (73.029 kg)  BMI 32.50 kg/m2  SpO2 100%  LMP 04/10/2014 Physical Exam  Nursing note and vitals reviewed. Constitutional: She is oriented to person, place, and time. She appears well-developed and well-nourished. No distress.  HENT:  Head: Normocephalic and atraumatic.  Eyes: Conjunctivae and  EOM are normal.  Cardiovascular: Regular rhythm.  Tachycardia present.   Pulmonary/Chest: Effort normal. No stridor. No respiratory distress. She has no wheezes. She has no rales.  Slightly diminished breath sounds on the right.  Abdominal: She exhibits no distension.  Musculoskeletal: She exhibits no edema.  Neurological: She is alert and oriented to person, place, and time. No cranial nerve deficit.  Skin: Skin is warm and dry.  Psychiatric: She has a normal mood and affect.    ED Course  Procedures (including critical care time)  COORDINATION OF CARE: 7:53 PM-Discussed treatment plan which includes IV fluids and labs with pt at bedside and pt agreed to plan.    Labs Review Labs Reviewed  CBC WITH DIFFERENTIAL - Abnormal; Notable for the following:     WBC 17.9 (*)    Neutro Abs 13.8 (*)    Monocytes Absolute 1.5 (*)    All other components within normal limits  COMPREHENSIVE METABOLIC PANEL - Abnormal; Notable for the following:    Glucose, Bld 108 (*)    GFR calc non Af Amer 81 (*)    All other components within normal limits  INFLUENZA PANEL BY PCR (TYPE A & B, H1N1)    Imaging Review Dg Chest 2 View  05/15/2014   CLINICAL DATA:  Shortness of breath since this afternoon with left-sided chest pain and fever  EXAM: CHEST  2 VIEW  COMPARISON:  05/24/2012  FINDINGS: Moderate sigmoid scoliotic curvature of the thoracolumbar spine. Heart size upper normal and stable. Vascular pattern normal. Lungs clear. No pleural effusions.  IMPRESSION: No active cardiopulmonary disease.   Electronically Signed   By: Esperanza Heiraymond  Rubner M.D.   On: 05/15/2014 20:20     EKG Interpretation   Date/Time:  Tuesday May 15 2014 19:16:36 EDT Ventricular Rate:  113 PR Interval:  128 QRS Duration: 64 QT Interval:  292 QTC Calculation: 400 R Axis:   45 Text Interpretation:  Sinus tachycardia Possible Left atrial enlargement  Nonspecific T wave abnormality Abnormal ECG Sinus tachycardia ST-t wave  abnormality Abnormal ekg Confirmed by Gerhard MunchLOCKWOOD, ROBERT  MD [phone]([id number]) on  05/15/2014 8:12:27 PM     On repeat exam the patient is afebrile, heart rate has diminished substantially. Had a lengthy conversation about her likely diagnosis of pneumonia, and consideration of viral versus bacterial causes. Given the recent diagnosis of a family member with bacterial pneumonia, as well as her fever, tachycardia, cough, she will be started on empiric therapy for bacterial pneumonia, though her x-ray does not clearly demonstrate this. Patient will follow up with primary care MDM    I personally performed the services described in this documentation, which was scribed in my presence. The recorded information has been reviewed and is accurate.   This previously well female  presents with no fever, cough, chills, tachycardia.  Patient is awake and alert, neurologically intact and hemodynamically stable after IV fluid resuscitation and Tylenol. Given the patient's constellation of findings, pneumonia is the most likely diagnosis. Given the absence of hypoxia, which has been PE or ACS will seem unlikely. Patient will be discharged to follow up with primary care   Gerhard Munchobert Lockwood, MD 05/15/14 2333

## 2014-05-15 NOTE — Discharge Instructions (Signed)
As discussed, your evaluation suggests that you have pneumonia.  Additional tests are pending, but you are being started on a therapy for bacterial pneumonia.  It is very important that you followup with your primary care physician to insure this condition is managed appropriately.  If you are unable to followup with your physician, please use the provided reference to followup with one of our local physicians.  Return here for concerning changes in your condition.

## 2014-05-15 NOTE — ED Notes (Signed)
MD at bedside to discuss results of testing and plan of care. 

## 2014-05-15 NOTE — ED Notes (Signed)
Patient transported to X-ray via stretcher per tech. 

## 2014-05-15 NOTE — ED Notes (Signed)
MD at bedside. 

## 2014-05-16 LAB — INFLUENZA PANEL BY PCR (TYPE A & B)
H1N1FLUPCR: NOT DETECTED
Influenza A By PCR: NEGATIVE
Influenza B By PCR: NEGATIVE

## 2014-09-24 ENCOUNTER — Encounter (HOSPITAL_BASED_OUTPATIENT_CLINIC_OR_DEPARTMENT_OTHER): Payer: Self-pay | Admitting: Emergency Medicine

## 2020-07-23 ENCOUNTER — Other Ambulatory Visit: Payer: Self-pay

## 2020-07-23 ENCOUNTER — Encounter: Payer: Self-pay | Admitting: Emergency Medicine

## 2020-07-23 ENCOUNTER — Ambulatory Visit
Admission: EM | Admit: 2020-07-23 | Discharge: 2020-07-23 | Disposition: A | Discharge status: Home / Self Care | Payer: 59 | Attending: Emergency Medicine | Admitting: Emergency Medicine

## 2020-07-23 DIAGNOSIS — R519 Headache, unspecified: Secondary | ICD-10-CM

## 2020-07-23 MED ORDER — DEXAMETHASONE SODIUM PHOSPHATE 10 MG/ML IJ SOLN
10.0000 mg | Freq: Once | INTRAMUSCULAR | Status: AC
  Administered 2020-07-23: 10 mg via INTRAMUSCULAR

## 2020-07-23 MED ORDER — METOCLOPRAMIDE HCL 5 MG/ML IJ SOLN
5.0000 mg | Freq: Once | INTRAMUSCULAR | Status: AC
  Administered 2020-07-23: 5 mg via INTRAMUSCULAR

## 2020-07-23 MED ORDER — NAPROXEN 500 MG PO TABS: 500.0000 mg | ORAL_TABLET | Freq: Two times a day (BID) | ORAL | 0 refills | Status: DC

## 2020-07-23 MED ORDER — KETOROLAC TROMETHAMINE 30 MG/ML IJ SOLN
30.0000 mg | Freq: Once | INTRAMUSCULAR | Status: AC
  Administered 2020-07-23: 30 mg via INTRAMUSCULAR

## 2020-07-23 NOTE — ED Provider Notes (Signed)
[facility]    CSN: [id number] Arrival date & time: 07/23/20  1742      History   Chief Complaint Chief Complaint  Patient presents with  . Headache    HPI Kristen Gonzalez is a 44 y.o. female presenting today for evaluation of a headache.  Patient reports that she has had an intermittent headache for the past week.  Pain is located in the right area of her head and wraps into the occipital area.  Pain is sharp and intense for approximately 15 minutes and subsides.  Symptoms have become more frequent and more intense as the week has progressed.  When this occurs she also feels a tightening sensation in her face.  Denies any facial drooping or difficulty speaking confusion at time of headache.  She denies any significant URI symptoms.  Had some slight sinus symptoms for approximately 1 day and some mild light sensitivity, but this subsided.  She denies any fevers or neck stiffness.  Pain has waking her up out of sleep.  Worse with movement.  Has had a history of headaches but different from recent headaches.  She received her second dose of Pfizer on 07/18/2020 and believes symptoms and headaches began the following day.  She has been using Goody's and Excedrin.     HPI  Past Medical History:  Diagnosis Date  . No pertinent past medical history     Patient Active Problem List   Diagnosis Date Noted  . Postpartum care following vaginal delivery (11/5) 09/30/2011    Past Surgical History:  Procedure Laterality Date  . APPENDECTOMY      OB History    Gravida  3   Para  2   Term  1   Preterm      AB  1   Living  2     SAB  1   TAB      Ectopic      Multiple      Live Births  1            Home Medications    Prior to Admission medications   Medication Sig Start Date End Date Taking? Authorizing Provider  IBUPROFEN PO Take by mouth.    [provider]  naproxen (NAPROSYN) 500 MG tablet Take 1 tablet (500 mg total) by mouth 2  (two) times daily. 07/23/20   Krish Bailly C, PA-C  prenatal vitamin w/FE, FA (PRENATAL 1 + 1) 27-1 MG TABS Take 1 tablet by mouth daily.      [provider]    Family History Family History  Problem Relation Age of Onset  . Hypertension Mother   . Diabetes Mother     Social History Social History   Tobacco Use  . Smoking status: Never Smoker  . Smokeless tobacco: Never Used  Substance Use Topics  . Alcohol use: No  . Drug use: No     Allergies   Patient has no known allergies.   Review of Systems Review of Systems  Constitutional: Negative for fatigue and fever.  HENT: Negative for congestion, sinus pressure and sore throat.   Eyes: Negative for photophobia, pain and visual disturbance.  Respiratory: Negative for cough and shortness of breath.   Cardiovascular: Negative for chest pain.  Gastrointestinal: Negative for abdominal pain, nausea and vomiting.  Genitourinary: Negative for decreased urine volume and hematuria.  Musculoskeletal: Negative for myalgias, neck pain and neck stiffness.  Neurological: Positive for headaches. Negative for dizziness, syncope,  facial asymmetry, speech difficulty, weakness, light-headedness and numbness.     Physical Exam Triage Vital Signs ED Triage Vitals  Enc Vitals Group     BP 07/23/20 1804 120/86     Pulse Rate 07/23/20 1804 85     Resp 07/23/20 1804 16     Temp 07/23/20 1804 98 F (36.7 C)     Temp Source 07/23/20 1804 Oral     SpO2 07/23/20 1804 100 %     Weight --      Height --      Head Circumference --      Peak Flow --      Pain Score 07/23/20 1817 7     Pain Loc --      Pain Edu? --      Excl. in GC? --    No data found.  Updated Vital Signs BP 120/86 (BP Location: Left Arm)   Pulse 85   Temp 98 F (36.7 C) (Oral)   Resp 16   SpO2 100%   Visual Acuity Right Eye Distance:   Left Eye Distance:   Bilateral Distance:    Right Eye Near:   Left Eye Near:    Bilateral Near:     Physical  Exam Vitals and nursing note reviewed.  Constitutional:      Appearance: She is well-developed.     Comments: No acute distress  HENT:     Head: Normocephalic and atraumatic.     Ears:     Comments: Bilateral ears without tenderness to palpation of external auricle, tragus and mastoid, EAC's without erythema or swelling, TM's with good bony landmarks and cone of light. Non erythematous.     Nose: Nose normal.     Mouth/Throat:     Comments: Oral mucosa pink and moist, no tonsillar enlargement or exudate. Posterior pharynx patent and nonerythematous, no uvula deviation or swelling. Normal phonation.  Eyes:     Conjunctiva/sclera: Conjunctivae normal.  Cardiovascular:     Rate and Rhythm: Normal rate and regular rhythm.  Pulmonary:     Effort: Pulmonary effort is normal. No respiratory distress.     Comments: Breathing comfortably at rest, CTABL, no wheezing, rales or other adventitious sounds auscultated  Abdominal:     General: There is no distension.  Musculoskeletal:        General: Normal range of motion.     Cervical back: Neck supple.  Skin:    General: Skin is warm and dry.  Neurological:     Mental Status: She is alert and oriented to person, place, and time.     Comments: Patient A&O x3, cranial nerves II-XII grossly intact, strength at shoulders, hips and knees 5/5, equal bilaterally, patellar reflex 2+ bilaterally. Negative Romberg and Pronator Drift. Gait without abnormality.      UC Treatments / Results  Labs (all labs ordered are listed, but only abnormal results are displayed) Labs Reviewed - No data to display  EKG   Radiology No results found.  Procedures Procedures (including critical care time)  Medications Ordered in UC Medications  ketorolac (TORADOL) 30 MG/ML injection 30 mg (30 mg Intramuscular Given 07/23/20 1902)  metoCLOPramide (REGLAN) injection 5 mg (5 mg Intramuscular Given 07/23/20 1903)  dexamethasone (DECADRON) injection 10 mg (10 mg  Intramuscular Given 07/23/20 1903)    Initial Impression / Assessment and Plan / UC Course  I have reviewed the triage vital signs and the nursing notes.  Pertinent labs & imaging results that were available  during my care of the patient were reviewed by me and considered in my medical decision making (see chart for details).     Given new pattern of headaches with nighttime awakenings and progressively worsening recommending follow-up with neurology for further evaluation of headaches.  Referral placed.  Opting to treat with migraine cocktail prior to discharge to try and attempt to break the cycle of these headaches, provided Toradol Decadron and Reglan.  Naprosyn at home for further headache relief.  Discussed strict return precautions. Patient verbalized understanding and is agreeable with plan.  Final Clinical Impressions(s) / UC Diagnoses   Final diagnoses:  Acute nonintractable headache, unspecified headache type     Discharge Instructions     We gave you an injection of Toradol Decadron and Reglan today for your headache. Please continue with Naprosyn twice daily as needed for headaches Referral placed to neurology-they will contact you for follow-up Any symptoms worsening please follow-up with emergency room    ED Prescriptions    Medication Sig Dispense Auth. Provider   naproxen (NAPROSYN) 500 MG tablet Take 1 tablet (500 mg total) by mouth 2 (two) times daily. 30 tablet Matilde Markie, [place] C, PA-C     PDMP not reviewed this encounter.   Wayman Hoard, [place] C, PA-C 07/24/20 1223

## 2020-07-23 NOTE — ED Triage Notes (Signed)
Pt here for HA to back of head into face that comes and goes x 1 week after having second covid vaccine

## 2020-07-23 NOTE — Discharge Instructions (Signed)
We gave you an injection of Toradol Decadron and Reglan today for your headache. Please continue with Naprosyn twice daily as needed for headaches Referral placed to neurology-they will contact you for follow-up Any symptoms worsening please follow-up with emergency room

## 2020-08-21 ENCOUNTER — Encounter: Payer: Self-pay | Admitting: Neurology

## 2020-08-21 ENCOUNTER — Ambulatory Visit (INDEPENDENT_AMBULATORY_CARE_PROVIDER_SITE_OTHER): Payer: 59 | Admitting: Neurology

## 2020-08-21 ENCOUNTER — Other Ambulatory Visit: Payer: Self-pay

## 2020-08-21 VITALS — BP 104/74 | HR 73 | Ht 59.5 in | Wt 159.0 lb

## 2020-08-21 DIAGNOSIS — H539 Unspecified visual disturbance: Secondary | ICD-10-CM

## 2020-08-21 DIAGNOSIS — R519 Headache, unspecified: Secondary | ICD-10-CM

## 2020-08-21 DIAGNOSIS — G44019 Episodic cluster headache, not intractable: Secondary | ICD-10-CM | POA: Diagnosis not present

## 2020-08-21 DIAGNOSIS — R51 Headache with orthostatic component, not elsewhere classified: Secondary | ICD-10-CM

## 2020-08-21 NOTE — Patient Instructions (Signed)
MRI of the brain bloodwork

## 2020-08-21 NOTE — Progress Notes (Signed)
[facility]    Provider:  Dr Lucia Gaskins Requesting Provider: Foy Guadalajara (ED) Primary Care Provider:  None  CC:  Headache after covid vaccine  HPI:  Kristen Gonzalez is a 44 y.o. female here as requested by Lew Dawes, PA-C for  Headaches.  No significant past medical history.  Patient reports that headache started about 2 days after she got her second Covid vaccine on August 26, they really cluster headaches and they have improved but still come, her face would feel tight, every other morning her face looks droopy but improves during the day, the headaches not as often as they were. No history of headaches. Aftr the covid vaccine they were acute in onset, severe "syop me in my tracks", last for 15 minutes and go away and come right back pain unbearable. Off and on more than 10 times a day. She would wake with the headaches and she could hardly not walk. On the side of the head (points to the parietal area), stabbing and pounding, she noticed both (confirms both) her eyes would droop and face tighten up, headache on the right but the possible droop was on the left, both eyes water, mess with her vision blurry vision, nothing made it worse or trigger it. Started 2 days after the vaccine. It was scary, this was her second vaccine. It is improving, she may have them 2 days out of the week now. Nothing made it better. They are less severe and getting better but still having same symptoms, morning headaches, blurry vision, positional in quality, vision changes. No family history of headaches. She can tolerate them now. No other focal neurologic deficits, associated symptoms, inciting events or modifiable factors.   Reviewed notes, labs and imaging from outside physicians, which showed:    CT head 2013 showed No acute intracranial abnormalities including mass lesion or mass effect, hydrocephalus, extra-axial fluid collection, midline shift, hemorrhage, or acute  infarction, large ischemic events (personally reviewed images)  Review of Systems: Patient complains of symptoms per HPI as well as the following symptoms: headache. Pertinent negatives and positives per HPI. All others negative.   Social History   Socioeconomic History  . Marital status: Single    Spouse name: Not on file  . Number of children: Not on file  . Years of education: Not on file  . Highest education level: Not on file  Occupational History  . Not on file  Tobacco Use  . Smoking status: Never Smoker  . Smokeless tobacco: Never Used  Vaping Use  . Vaping Use: Never used  Substance and Sexual Activity  . Alcohol use: No  . Drug use: No  . Sexual activity: Yes  Other Topics Concern  . Not on file  Social History Narrative   Lives with ex-husband   Right handed    Caffeine: venti starbucks frap at least 3x per week, 12 oz can of dr pepper every other day   Social Determinants of Health   Financial Resource Strain:   . Difficulty of Paying Living Expenses: Not on file  Food Insecurity:   . Worried About Programme researcher, broadcasting/film/video in the Last Year: Not on file  . Ran Out of Food in the Last Year: Not on file  Transportation Needs:   . Lack of Transportation (Medical): Not on file  . Lack of Transportation (Non-Medical): Not on file  Physical Activity:   . Days of Exercise per Week: Not on file  . Minutes  of Exercise per Session: Not on file  Stress:   . Feeling of Stress : Not on file  Social Connections:   . Frequency of Communication with Friends and Family: Not on file  . Frequency of Social Gatherings with Friends and Family: Not on file  . Attends Religious Services: Not on file  . Active Member of Clubs or Organizations: Not on file  . Attends Banker Meetings: Not on file  . Marital Status: Not on file  Intimate Partner Violence:   . Fear of Current or Ex-Partner: Not on file  . Emotionally Abused: Not on file  . Physically Abused: Not on  file  . Sexually Abused: Not on file    Family History  Problem Relation Age of Onset  . Hypertension Mother   . Diabetes Mother   . Migraines Mother     Past Medical History:  Diagnosis Date  . No pertinent past medical history     Patient Active Problem List   Diagnosis Date Noted  . 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 Postpartum care following vaginal delivery (11/5) 09/30/2011    Past Surgical History:  Procedure Laterality Date  . APPENDECTOMY      Current Outpatient Medications  Medication Sig Dispense Refill  . IBUPROFEN PO Take by mouth.    . naproxen (NAPROSYN) 500 MG tablet Take 1 tablet (500 mg total) by mouth 2 (two) times daily. 30 tablet 0  . prenatal vitamin w/FE, FA (PRENATAL 1 + 1) 27-1 MG TABS Take 1 tablet by mouth daily.       No current facility-administered medications for this visit.    Allergies as of 08/21/2020  . (No Known Allergies)    Vitals: BP 104/74 (BP Location: Left Arm, Patient Position: Sitting)   Pulse 73   Ht 4' 11.5" (1.511 m)   Wt 159 lb (72.1 kg)   BMI 31.58 kg/m  Last Weight:  Wt Readings from Last 1 Encounters:  08/21/20 159 lb (72.1 kg)   Last Height:   Ht Readings from Last 1 Encounters:  08/21/20 4' 11.5" (1.511 m)     Physical exam: Exam: Gen: NAD, conversant, well nourised, obese, well groomed                     CV: RRR, no MRG. No Carotid Bruits. No peripheral edema, warm, nontender Eyes: Conjunctivae clear without exudates or hemorrhage  Neuro: Detailed Neurologic Exam  Speech:    Speech is normal; fluent and spontaneous with normal comprehension.  Cognition:    The patient is oriented to person, place, and time;     recent and remote memory intact;     language fluent;     normal attention, concentration,     fund of knowledge Cranial Nerves:    The pupils are equal, round, and reactive to light. The fundi are flat. Visual fields are full to finger confrontation.  Extraocular movements are intact. Trigeminal sensation is intact and the muscles of mastication are normal. The face is symmetric. The palate elevates in the midline. Hearing intact. Voice is normal. Shoulder shrug is normal. The tongue has normal motion without fasciculations.   Coordination:    Normal finger to nose and heel to shin. Normal rapid alternating movements.   Gait:    Heel-toe and tandem gait are normal.   Motor Observation:    No asymmetry, no atrophy, and no involuntary movements noted. Tone:    Normal muscle  tone.    Posture:    Posture is normal. normal erect    Strength:    Strength is V/V in the upper and lower limbs.      Sensation: intact to LT     Reflex Exam:  DTR's:    Deep tendon reflexes in the upper and lower extremities are normal bilaterally.   Toes:    The toes are downgoing bilaterally.   Clonus:    Clonus is absent.    Assessment/Plan:  Very nice 44 year old with headache (presentation similar to cluster-type) after 2nd dose of vaccine. No significant PMHx. No prior headaches but does have a FHx of migraines. Likely secondary to Covid vaccine but given concerning symptoms needs thorough evaluation. Encouraged finding a pcp and getting regular healthcare.   Orders Placed This Encounter  Procedures  . MR BRAIN W WO CONTRAST  . Comprehensive metabolic panel  . CBC with Differential/Platelets  . TSH     Cc: Foy Guadalajara,    Naomie Dean, MD  [facility] [address] [place], [place] [address]  Phone [phone] Fax [phone]

## 2020-08-22 ENCOUNTER — Telehealth: Payer: Self-pay | Admitting: Neurology

## 2020-08-22 LAB — COMPREHENSIVE METABOLIC PANEL
ALT: 10 IU/L (ref 0–32)
AST: 16 IU/L (ref 0–40)
Albumin/Globulin Ratio: 1.7 (ref 1.2–2.2)
Albumin: 4.1 g/dL (ref 3.8–4.8)
Alkaline Phosphatase: 58 IU/L (ref 44–121)
BUN/Creatinine Ratio: 12 (ref 9–23)
BUN: 11 mg/dL (ref 6–24)
Bilirubin Total: 0.4 mg/dL (ref 0.0–1.2)
CO2: 22 mmol/L (ref 20–29)
Calcium: 9 mg/dL (ref 8.7–10.2)
Chloride: 104 mmol/L (ref 96–106)
Creatinine, Ser: 0.95 mg/dL (ref 0.57–1.00)
GFR calc Af Amer: 85 mL/min/{1.73_m2} (ref >59)
GFR calc non Af Amer: 74 mL/min/{1.73_m2} (ref >59)
Globulin, Total: 2.4 g/dL (ref 1.5–4.5)
Glucose: 81 mg/dL (ref 65–99)
Potassium: 4.3 mmol/L (ref 3.5–5.2)
Sodium: 139 mmol/L (ref 134–144)
Total Protein: 6.5 g/dL (ref 6.0–8.5)

## 2020-08-22 LAB — CBC WITH DIFFERENTIAL/PLATELET
Basophils Absolute: 0.1 10*3/uL (ref 0.0–0.2)
Basos: 1 %
EOS (ABSOLUTE): 0.4 10*3/uL (ref 0.0–0.4)
Eos: 4 %
Hematocrit: 37.9 % (ref 34.0–46.6)
Hemoglobin: 12.5 g/dL (ref 11.1–15.9)
Immature Grans (Abs): 0 10*3/uL (ref 0.0–0.1)
Immature Granulocytes: 0 %
Lymphocytes Absolute: 3.2 10*3/uL — ABNORMAL HIGH (ref 0.7–3.1)
Lymphs: 32 %
MCH: 29.6 pg (ref 26.6–33.0)
MCHC: 33 g/dL (ref 31.5–35.7)
MCV: 90 fL (ref 79–97)
Monocytes Absolute: 0.8 10*3/uL (ref 0.1–0.9)
Monocytes: 8 %
Neutrophils Absolute: 5.4 10*3/uL (ref 1.4–7.0)
Neutrophils: 55 %
Platelets: 298 10*3/uL (ref 150–450)
RBC: 4.22 x10E6/uL (ref 3.77–5.28)
RDW: 13.6 % (ref 11.7–15.4)
WBC: 9.8 10*3/uL (ref 3.4–10.8)

## 2020-08-22 LAB — TSH: TSH: 1.96 u[IU]/mL (ref 0.450–4.500)

## 2020-08-22 NOTE — Telephone Encounter (Signed)
no to the covid questions MR Brain w/wo contrast Dr. Lucia Gaskins [facility] Auth: [id number] (exp. 08/21/20 to 10/05/20). Patient is scheduled at [facility] for 08/27/20.

## 2020-08-26 ENCOUNTER — Telehealth: Payer: Self-pay | Admitting: *Deleted

## 2020-08-26 NOTE — Telephone Encounter (Signed)
-----   Message from Anson Fret, MD sent at 08/22/2020  6:05 PM EDT ----- Labs unremarkable, essentially normal thanks

## 2020-08-26 NOTE — Telephone Encounter (Signed)
Spoke with pt and advised her labs are unremarkable, essentially normal. Pt verbalized understanding. She has MRI tomorrow. Pt aware results will be called to her when available. She verbalized appreciation for the call.

## 2020-08-27 ENCOUNTER — Other Ambulatory Visit: Payer: Self-pay

## 2020-08-27 ENCOUNTER — Ambulatory Visit: Payer: 59

## 2020-08-27 DIAGNOSIS — G44019 Episodic cluster headache, not intractable: Secondary | ICD-10-CM | POA: Diagnosis not present

## 2020-08-27 DIAGNOSIS — R51 Headache with orthostatic component, not elsewhere classified: Secondary | ICD-10-CM | POA: Diagnosis not present

## 2020-08-27 DIAGNOSIS — R519 Headache, unspecified: Secondary | ICD-10-CM

## 2020-08-27 DIAGNOSIS — H539 Unspecified visual disturbance: Secondary | ICD-10-CM

## 2020-08-27 MED ORDER — GADOBENATE DIMEGLUMINE 529 MG/ML IV SOLN
15.0000 mL | Freq: Once | INTRAVENOUS | Status: AC | PRN
  Administered 2020-08-27: 15 mL via INTRAVENOUS

## 2020-08-29 ENCOUNTER — Telehealth: Payer: Self-pay | Admitting: *Deleted

## 2020-08-29 NOTE — Telephone Encounter (Signed)
-----   Message from Anson Fret, MD sent at 08/29/2020  9:58 AM EDT ----- MRI of the brain is normal, great news. thanks

## 2020-08-29 NOTE — Telephone Encounter (Signed)
Spoke with pt and advised her MRI brain is normal. Pt was very appreciative and verbalized understanding. She will return if symptoms worsen or fail to improve.

## 2020-09-09 ENCOUNTER — Ambulatory Visit: Payer: 59 | Admitting: Neurology

## 2021-02-09 ENCOUNTER — Other Ambulatory Visit: Payer: Self-pay

## 2021-02-09 ENCOUNTER — Encounter (HOSPITAL_BASED_OUTPATIENT_CLINIC_OR_DEPARTMENT_OTHER): Payer: Self-pay | Admitting: *Deleted

## 2021-02-09 ENCOUNTER — Emergency Department (HOSPITAL_BASED_OUTPATIENT_CLINIC_OR_DEPARTMENT_OTHER)
Admission: EM | Admit: 2021-02-09 | Discharge: 2021-02-09 | Disposition: A | Discharge status: Home / Self Care | Payer: 59 | Attending: Emergency Medicine | Admitting: Emergency Medicine

## 2021-02-09 ENCOUNTER — Emergency Department (HOSPITAL_BASED_OUTPATIENT_CLINIC_OR_DEPARTMENT_OTHER): Payer: 59

## 2021-02-09 DIAGNOSIS — R519 Headache, unspecified: Secondary | ICD-10-CM | POA: Diagnosis not present

## 2021-02-09 DIAGNOSIS — R079 Chest pain, unspecified: Secondary | ICD-10-CM | POA: Diagnosis not present

## 2021-02-09 DIAGNOSIS — R059 Cough, unspecified: Secondary | ICD-10-CM | POA: Diagnosis not present

## 2021-02-09 DIAGNOSIS — Z20822 Contact with and (suspected) exposure to covid-19: Secondary | ICD-10-CM | POA: Diagnosis not present

## 2021-02-09 DIAGNOSIS — Z8616 Personal history of COVID-19: Secondary | ICD-10-CM | POA: Diagnosis not present

## 2021-02-09 DIAGNOSIS — R0602 Shortness of breath: Secondary | ICD-10-CM | POA: Diagnosis present

## 2021-02-09 LAB — CBC WITH DIFFERENTIAL/PLATELET
Abs Immature Granulocytes: 0.03 10*3/uL (ref 0.00–0.07)
Basophils Absolute: 0.1 10*3/uL (ref 0.0–0.1)
Basophils Relative: 1 %
Eosinophils Absolute: 0.4 10*3/uL (ref 0.0–0.5)
Eosinophils Relative: 5 %
HCT: 37.4 % (ref 36.0–46.0)
Hemoglobin: 12.2 g/dL (ref 12.0–15.0)
Immature Granulocytes: 0 %
Lymphocytes Relative: 33 %
Lymphs Abs: 3 10*3/uL (ref 0.7–4.0)
MCH: 29.5 pg (ref 26.0–34.0)
MCHC: 32.6 g/dL (ref 30.0–36.0)
MCV: 90.6 fL (ref 80.0–100.0)
Monocytes Absolute: 0.8 10*3/uL (ref 0.1–1.0)
Monocytes Relative: 9 %
Neutro Abs: 4.9 10*3/uL (ref 1.7–7.7)
Neutrophils Relative %: 52 %
Platelets: 258 10*3/uL (ref 150–400)
RBC: 4.13 MIL/uL (ref 3.87–5.11)
RDW: 14.2 % (ref 11.5–15.5)
WBC: 9.2 10*3/uL (ref 4.0–10.5)
nRBC: 0 % (ref 0.0–0.2)

## 2021-02-09 LAB — COMPREHENSIVE METABOLIC PANEL
ALT: 15 U/L (ref 0–44)
AST: 17 U/L (ref 15–41)
Albumin: 3.7 g/dL (ref 3.5–5.0)
Alkaline Phosphatase: 44 U/L (ref 38–126)
Anion gap: 7 (ref 5–15)
BUN: 11 mg/dL (ref 6–20)
CO2: 24 mmol/L (ref 22–32)
Calcium: 8.4 mg/dL — ABNORMAL LOW (ref 8.9–10.3)
Chloride: 106 mmol/L (ref 98–111)
Creatinine, Ser: 1 mg/dL (ref 0.44–1.00)
GFR, Estimated: >60 mL/min (ref >60)
Glucose, Bld: 91 mg/dL (ref 70–99)
Potassium: 4.1 mmol/L (ref 3.5–5.1)
Sodium: 137 mmol/L (ref 135–145)
Total Bilirubin: 0.4 mg/dL (ref 0.3–1.2)
Total Protein: 6.3 g/dL — ABNORMAL LOW (ref 6.5–8.1)

## 2021-02-09 LAB — SARS CORONAVIRUS 2 (TAT 6-24 HRS): SARS Coronavirus 2: NEGATIVE

## 2021-02-09 LAB — TROPONIN I (HIGH SENSITIVITY): Troponin I (High Sensitivity): <2 ng/L (ref <18)

## 2021-02-09 MED ORDER — LIDOCAINE VISCOUS HCL 2 % MT SOLN
15.0000 mL | Freq: Once | OROMUCOSAL | Status: AC
  Administered 2021-02-09: 15 mL via ORAL
  Filled 2021-02-09: qty 15

## 2021-02-09 MED ORDER — ALUM & MAG HYDROXIDE-SIMETH 200-200-20 MG/5ML PO SUSP
30.0000 mL | Freq: Once | ORAL | Status: AC
  Administered 2021-02-09: 30 mL via ORAL
  Filled 2021-02-09: qty 30

## 2021-02-09 MED ORDER — ALBUTEROL SULFATE HFA 108 (90 BASE) MCG/ACT IN AERS: 2.0000 | INHALATION_SPRAY | RESPIRATORY_TRACT | 0 refills | Status: DC | PRN

## 2021-02-09 NOTE — ED Triage Notes (Signed)
Took a at home Covid Test 3 weeks ago due to having a cough, feels like she is having more chest pain, severe heart burn and more Shortness of breath. Has been using her sons Albuterol Inhaler

## 2021-02-09 NOTE — ED Provider Notes (Signed)
[facility] EMERGENCY DEPARTMENT Provider Note   CSN: [id number] Arrival date & time: 02/09/21  1248     History Chief Complaint  Patient presents with  . Shortness of Breath    Kristen Gonzalez is a 45 y.o. female.  45 y.o female with no PMH presents to the ED with a chief complaint of shortness of breath, cough, chest pain x 3 weeks. Patient tested positive for Covid 19 around ~ 3 weeks ago with a home test.  She reports taking care of her symptoms with TheraFlu, over-the-counter medication.  Still endorses shortness of breath which has only been worsening.  This is exacerbated by ambulation and improved with breathing treatments.  She does not have any prior history of asthma, however has been using her son's inhaler along with nebulizer.  Also endorses chest pressure, this is exacerbated with her dry cough.  In addition, she also reports experiencing heartburn since her COVID-19 diagnoses, this used to be relieved with Tums, however states there has been not much improvement despite diet changes as well.She denies any prior hx of blood clots, no prior hx of CAD, no leg swelling or other complaints.   The history is provided by the patient.  Shortness of Breath Severity:  Moderate Onset quality:  Sudden Duration:  3 weeks Timing:  Intermittent Progression:  Worsening Chronicity:  New Context: known allergens and URI   Relieved by:  Inhaler and oxygen Worsened by:  Coughing and movement Ineffective treatments:  Rest Associated symptoms: chest pain, cough and headaches   Associated symptoms: no abdominal pain, no fever and no vomiting   Risk factors: no recent alcohol use, no hx of cancer, no hx of PE/DVT, no oral contraceptive use and no tobacco use        Past Medical History:  Diagnosis Date  . No pertinent past medical history     Patient Active Problem List   Diagnosis Date Noted  . 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  Postpartum care following vaginal delivery (11/5) 09/30/2011    Past Surgical History:  Procedure Laterality Date  . APPENDECTOMY       OB History    Gravida  3   Para  2   Term  1   Preterm      AB  1   Living  2     SAB  1   IAB      Ectopic      Multiple      Live Births  1           Family History  Problem Relation Age of Onset  . Hypertension Mother   . Diabetes Mother   . Migraines Mother     Social History   Tobacco Use  . Smoking status: Never Smoker  . Smokeless tobacco: Never Used  Vaping Use  . Vaping Use: Never used  Substance Use Topics  . Alcohol use: No  . Drug use: No    Home Medications Prior to Admission medications   Medication Sig Start Date End Date Taking? Authorizing Provider  albuterol (VENTOLIN HFA) 108 (90 Base) MCG/ACT inhaler Inhale 2 puffs into the lungs every 4 (four) hours as needed for wheezing or shortness of breath. 02/09/21 03/11/21 Yes Soto, Johana, PA-C  IBUPROFEN PO Take by mouth.    [provider]  naproxen (NAPROSYN) 500 MG tablet Take 1 tablet (500 mg total) by mouth 2 (two) times daily. 07/23/20   Wieters,  Hallie C, PA-C  tinidazole (TINDAMAX) 500 MG tablet Take 1,000 mg by mouth daily. 12/11/20   [provider]    Allergies    Patient has no known allergies.  Review of Systems   Review of Systems  Constitutional: Negative for fever.  Respiratory: Positive for cough, chest tightness and shortness of breath.   Cardiovascular: Positive for chest pain. Negative for leg swelling.  Gastrointestinal: Negative for abdominal pain, diarrhea, nausea and vomiting.  Genitourinary: Negative for flank pain.  Musculoskeletal: Negative for back pain.  Skin: Negative for pallor and wound.  Neurological: Positive for headaches. Negative for weakness.  All other systems reviewed and are negative.   Physical Exam Updated Vital Signs BP 115/80 (BP Location: Right Arm)   Pulse 77   Temp 98.4 F  (36.9 C) (Oral)   Resp 14   Ht 4\' 11"  (1.499 m)   Wt 73 kg   SpO2 100%   BMI 32.52 kg/m   Physical Exam Vitals and nursing note reviewed.  Constitutional:      Appearance: She is well-developed.  HENT:     Head: Normocephalic and atraumatic.     Mouth/Throat:     Pharynx: Oropharynx is clear. Uvula midline. Posterior oropharyngeal erythema present. No oropharyngeal exudate.     Comments: Oropharynx with mild erythema, no tonsillar exudates, no tonsillar abscess, uvula is midline.  Cardiovascular:     Rate and Rhythm: Normal rate.     Comments: No BL calf tenderness or pitting edema.  Pulmonary:     Effort: Pulmonary effort is normal.     Breath sounds: Normal breath sounds. No decreased breath sounds, wheezing, rhonchi or rales.  Chest:     Chest wall: Tenderness present.  Abdominal:     Palpations: Abdomen is soft.     Tenderness: There is no abdominal tenderness.  Musculoskeletal:     Right lower leg: No tenderness. No edema.     Left lower leg: No tenderness. No edema.  Skin:    General: Skin is warm and dry.  Neurological:     Mental Status: She is alert and oriented to person, place, and time.     ED Results / Procedures / Treatments   Labs (all labs ordered are listed, but only abnormal results are displayed) Labs Reviewed  COMPREHENSIVE METABOLIC PANEL - Abnormal; Notable for the following components:      Result Value   Calcium 8.4 (*)    Total Protein 6.3 (*)    All other components within normal limits  SARS CORONAVIRUS 2 (TAT 6-24 HRS)  CBC WITH DIFFERENTIAL/PLATELET  TROPONIN I (HIGH SENSITIVITY)    EKG None  Radiology DG Chest Portable 1 View  Result Date: 02/09/2021 CLINICAL DATA:  Cough and headache for 3 weeks. EXAM: PORTABLE CHEST 1 VIEW COMPARISON:  PA and lateral chest 05/15/2014. FINDINGS: Lungs clear. Heart size normal. No pneumothorax or pleural effusion. Convex right scoliosis is unchanged. IMPRESSION: No acute disease. Electronically  Signed   By: 05/17/2014 M.D.   On: 02/09/2021 13:57    Procedures Procedures   Medications Ordered in ED Medications  alum & mag hydroxide-simeth (MAALOX/MYLANTA) 200-200-20 MG/5ML suspension 30 mL (30 mLs Oral Given 02/09/21 1451)    And  lidocaine (XYLOCAINE) 2 % viscous mouth solution 15 mL (15 mLs Oral Given 02/09/21 1451)    ED Course  I have reviewed the triage vital signs and the nursing notes.  Pertinent labs & imaging results that were available during my care  of the patient were reviewed by me and considered in my medical decision making (see chart for details).    MDM Rules/Calculators/A&P    Patient arrived to the ED with a URI symptoms including SOB, cough x 3 weeks. Tested + for Covid 19 with a home test, and treated her symptoms at home with TheraFlu along with her son's inhaler and nebulizer treatment.  Today she reports her shortness of breath has worsened, she has been continue to use the breathing treatments but states that the shortness of breath is exacerbated with any ambulation.  She also reports a dry cough, stopped taking TheraFlu about a week ago as she felt like this was likely giving her reflux.  No prior history of reflux.  During primary evaluation, patient overall well-appearing, lungs are clear to auscultation without any wheezing, rhonchi, rales.  Abdomen is soft, nontender to palpation.  Oropharynx is clear without any tonsillar exudate, uvula is midline.  Interpretation of her labs reveal a CBC without any leukocytosis, hemoglobin stable without any signs of anemia.  CMP without any electrolyte normalities, creatinine level is within normal limits.  LFTs are unremarkable.  COVID-19 swab has been obtained and sent off although with recent infection this could likely be falsely positive.  Xray of the chest without any consolidation, no pleural effusion or other acute finding   Results were discussed with patient at length, he does report some  improvement in her symptoms after GI cocktail.  She does report she is been having more chest pain.  She does not have any risk factors such as high blood pressure, hyperlipidemia, prior history of CAD, family history of CAD.  We did discuss obtaining a troponin at this time.  Although I have a very low suspicion for ACS.  Did discuss that chest pain likely residual from prior infection of COVID-19 along with coughing fits.  Troponin obtained which was negative, EKG without any signs consistent with infarct.  We discussed the results of all lab work, she will go home with the inhaler, patient stable for discharge with stable vital signs.  Kristen Gonzalez was evaluated in Emergency Department on 02/09/2021 for the symptoms described in the history of present illness. She was evaluated in the context of the global COVID-19 pandemic, which necessitated consideration that the patient might be at risk for infection with the SARS-CoV-2 virus that causes COVID-19. Institutional protocols and algorithms that pertain to the evaluation of patients at risk for COVID-19 are in a state of rapid change based on information released by regulatory bodies including the CDC and federal and state organizations. These policies and algorithms were followed during the patient's care in the ED.   Portions of this note were generated with Scientist, clinical (histocompatibility and immunogenetics). Dictation errors may occur despite best attempts at proofreading.  Final Clinical Impression(s) / ED Diagnoses Final diagnoses:  Shortness of breath    Rx / DC Orders ED Discharge Orders         Ordered    albuterol (VENTOLIN HFA) 108 (90 Base) MCG/ACT inhaler  Every 4 hours PRN        02/09/21 1532           Claude Manges, PA-C 02/09/21 1644    Alvira Monday, MD 02/11/21 [phone]

## 2021-02-09 NOTE — ED Notes (Signed)
Ambulated form lobby to room 11, HR 90-100, SpO2 99-100%, no increased WOB noted, no cough.   BBS equal & CTA

## 2021-02-09 NOTE — ED Notes (Signed)
HHN with albuterol ~6mins. PTA

## 2021-02-09 NOTE — Discharge Instructions (Addendum)
Your chest x-ray did not show any signs of pneumonia.  We discussed the rest of your labs at length.  We discussed symptomatic treatment for your likely residual COVID-19 infection with over-the-counter medication.  I have also provided an inhaler which should help with your breathing, please use this only as needed.  Follow-up with your primary care for reevaluation in symptoms within 1 week.

## 2021-10-09 ENCOUNTER — Emergency Department (HOSPITAL_BASED_OUTPATIENT_CLINIC_OR_DEPARTMENT_OTHER)
Admission: EM | Admit: 2021-10-09 | Discharge: 2021-10-09 | Disposition: A | Discharge status: Home / Self Care | Payer: 59 | Attending: Emergency Medicine | Admitting: Emergency Medicine

## 2021-10-09 ENCOUNTER — Other Ambulatory Visit: Payer: Self-pay

## 2021-10-09 ENCOUNTER — Encounter (HOSPITAL_BASED_OUTPATIENT_CLINIC_OR_DEPARTMENT_OTHER): Payer: Self-pay | Admitting: *Deleted

## 2021-10-09 DIAGNOSIS — M79601 Pain in right arm: Secondary | ICD-10-CM | POA: Diagnosis present

## 2021-10-09 DIAGNOSIS — R200 Anesthesia of skin: Secondary | ICD-10-CM | POA: Insufficient documentation

## 2021-10-09 LAB — COMPREHENSIVE METABOLIC PANEL
ALT: 12 U/L (ref 0–44)
AST: 13 U/L — ABNORMAL LOW (ref 15–41)
Albumin: 3.8 g/dL (ref 3.5–5.0)
Alkaline Phosphatase: 49 U/L (ref 38–126)
Anion gap: 5 (ref 5–15)
BUN: 13 mg/dL (ref 6–20)
CO2: 24 mmol/L (ref 22–32)
Calcium: 8.2 mg/dL — ABNORMAL LOW (ref 8.9–10.3)
Chloride: 104 mmol/L (ref 98–111)
Creatinine, Ser: 0.93 mg/dL (ref 0.44–1.00)
GFR, Estimated: >60 mL/min (ref >60)
Glucose, Bld: 88 mg/dL (ref 70–99)
Potassium: 4 mmol/L (ref 3.5–5.1)
Sodium: 133 mmol/L — ABNORMAL LOW (ref 135–145)
Total Bilirubin: 0.3 mg/dL (ref 0.3–1.2)
Total Protein: 6.4 g/dL — ABNORMAL LOW (ref 6.5–8.1)

## 2021-10-09 LAB — CBC WITH DIFFERENTIAL/PLATELET
Abs Immature Granulocytes: 0.06 10*3/uL (ref 0.00–0.07)
Basophils Absolute: 0.1 10*3/uL (ref 0.0–0.1)
Basophils Relative: 1 %
Eosinophils Absolute: 0.3 10*3/uL (ref 0.0–0.5)
Eosinophils Relative: 3 %
HCT: 37.9 % (ref 36.0–46.0)
Hemoglobin: 12.5 g/dL (ref 12.0–15.0)
Immature Granulocytes: 1 %
Lymphocytes Relative: 33 %
Lymphs Abs: 4 10*3/uL (ref 0.7–4.0)
MCH: 29.6 pg (ref 26.0–34.0)
MCHC: 33 g/dL (ref 30.0–36.0)
MCV: 89.6 fL (ref 80.0–100.0)
Monocytes Absolute: 1.1 10*3/uL — ABNORMAL HIGH (ref 0.1–1.0)
Monocytes Relative: 9 %
Neutro Abs: 6.6 10*3/uL (ref 1.7–7.7)
Neutrophils Relative %: 53 %
Platelets: 251 10*3/uL (ref 150–400)
RBC: 4.23 MIL/uL (ref 3.87–5.11)
RDW: 14.6 % (ref 11.5–15.5)
WBC: 12.1 10*3/uL — ABNORMAL HIGH (ref 4.0–10.5)
nRBC: 0 % (ref 0.0–0.2)

## 2021-10-09 MED ORDER — ACETAMINOPHEN 500 MG PO TABS
1000.0000 mg | ORAL_TABLET | Freq: Once | ORAL | Status: AC
  Administered 2021-10-09: 21:00:00 1000 mg via ORAL
  Filled 2021-10-09: qty 2

## 2021-10-09 MED ORDER — GABAPENTIN 100 MG PO CAPS
200.0000 mg | ORAL_CAPSULE | ORAL | Status: AC
  Administered 2021-10-09: 21:00:00 200 mg via ORAL
  Filled 2021-10-09: qty 2

## 2021-10-09 MED ORDER — GABAPENTIN 100 MG PO CAPS: 100.0000 mg | ORAL_CAPSULE | Freq: Three times a day (TID) | ORAL | 0 refills | Status: DC

## 2021-10-09 NOTE — Discharge Instructions (Addendum)
Please follow-up with your primary care doctor.  Please use Tylenol or ibuprofen for pain.  You may use 600 mg ibuprofen every 6 hours or 1000 mg of Tylenol every 6 hours.  You may choose to alternate between the 2.  This would be most effective.  Not to exceed 4 g of Tylenol within 24 hours.  Not to exceed 3200 mg ibuprofen 24 hours.   I have also prescribed you gabapentin to take as needed for pain.  You may take it up to 3 times a day may take as few as 1 tablet 3 times a day and is much as 3 tablets 3 times a day.  I have also given you the information for a neurologist should continue to follow-up with them.

## 2021-10-09 NOTE — ED Provider Notes (Signed)
[facility] EMERGENCY DEPARTMENT Provider Note   CSN: [id number] Arrival date & time: 10/09/21  1859     History Chief Complaint  Patient presents with   Numbness    Kristen Gonzalez is a 45 y.o. female.  HPI Patient is a 45 year old female with past medical history significant for appendectomy.  She denies any other medical issues  She resents to the emergency room today with 3 to 4 weeks of constant right arm pain and numbness.  With further questioning it seems that she is describing paresthesias she states that her right arm feels like it has decreased sensation and she has a tingling sensation and occasionally burning stabbing pain in her right arm.  She denies any injuries to the area.  Denies any decrease in strength or proprioception.  She states that she has not been dropping objects or noticing any clumsiness of her hands.  She denies any headache or neck pain she denies any pain that radiates from her elbow upwards or downwards down her arm.  Seems to be worse with certain movements.  No other associate symptoms.  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   Postpartum care following vaginal delivery (11/5) 09/30/2011    Past Surgical History:  Procedure Laterality Date   APPENDECTOMY       OB History     Gravida  3   Para  2   Term  1   Preterm      AB  1   Living  2      SAB  1   IAB      Ectopic      Multiple      Live Births  1           Family History  Problem Relation Age of Onset   Hypertension Mother    Diabetes Mother    Migraines Mother     Social History   Tobacco Use   Smoking status: Never   Smokeless tobacco: Never  Vaping Use   Vaping Use: Never used  Substance Use Topics   Alcohol use: No    Drug use: No    Home Medications Prior to Admission medications   Medication Sig Start Date End Date Taking? Authorizing Provider  gabapentin (NEURONTIN) 100 MG capsule Take 1-3 capsules (100-300 mg total) by mouth 3 (three) times daily for 10 days. 10/09/21 10/19/21 Yes Aayana Reinertsen, Rodrigo Ran, PA  albuterol (VENTOLIN HFA) 108 (90 Base) MCG/ACT inhaler Inhale 2 puffs into the lungs every 4 (four) hours as needed for wheezing or shortness of breath. 02/09/21 03/11/21  Claude Manges, PA-C  IBUPROFEN PO Take by mouth.    [provider]  naproxen (NAPROSYN) 500 MG tablet Take 1 tablet (500 mg total) by mouth 2 (two) times daily. 07/23/20   Wieters, Hallie C, PA-C  tinidazole (TINDAMAX) 500 MG tablet Take 1,000 mg by mouth daily. 12/11/20   [provider]    Allergies    Patient has no known allergies.  Review of Systems   Review of Systems  Constitutional:  Negative for fever.  HENT:  Negative for congestion.   Respiratory:  Negative for shortness of breath.   Cardiovascular:  Negative for chest pain.  Gastrointestinal:  Negative for abdominal distention.  Musculoskeletal:        Right arm pain  Neurological:  Negative for dizziness and headaches.       Abnormal sensation in arm     Physical Exam Updated Vital Signs BP 120/88   Pulse 85   Temp 98.3 F (36.8 C) (Oral)   Resp (!) 26   Ht 4\' 11"  (1.499 m)   Wt 71.7 kg   LMP 09/18/2021   SpO2 100%   BMI 31.91 kg/m   Physical Exam Vitals and nursing note reviewed.  Constitutional:      General: She is not in acute distress. HENT:     Head: Normocephalic and atraumatic.     Nose: Nose normal.  Eyes:     General: No scleral icterus. Cardiovascular:     Rate and Rhythm: Normal rate and regular rhythm.     Pulses: Normal pulses.     Heart sounds: Normal heart sounds.  Pulmonary:     Effort: Pulmonary effort is normal. No respiratory distress.     Breath sounds: No wheezing.  Abdominal:     Palpations: Abdomen  is soft.     Tenderness: There is no abdominal tenderness.  Musculoskeletal:     Cervical back: Normal range of motion.     Right lower leg: No edema.     Left lower leg: No edema.  Skin:    General: Skin is warm and dry.     Capillary Refill: Capillary refill takes less than 2 seconds.  Neurological:     Mental Status: She is alert. Mental status is at baseline.     Comments: Sensation intact in bilateral upper extremities subjectively decreased in right upper extremity  Bilateral grip strength 5/5 flexion extension of elbows 5/5 strength.  Finger-to-nose intact bilaterally  Alert and oriented to self, place, time and event.   Speech is fluent, clear without dysarthria or dysphasia.   Strength 5/5 in upper/lower extremities   Sensation intact in upper/lower extremities    Normal gait.  CN I not tested  CN II grossly intact visual fields bilaterally. Did not visualize posterior eye.  CN III, IV, VI PERRLA and EOMs intact bilaterally  CN V Intact sensation to sharp and light touch to the face  CN VII facial movements symmetric  CN VIII not tested  CN IX, X no uvula deviation, symmetric rise of soft palate  CN XI 5/5 SCM and trapezius strength bilaterally  CN XII Midline tongue protrusion, symmetric L/R movements    Psychiatric:        Mood and Affect: Mood normal.        Behavior: Behavior normal.    ED Results / Procedures / Treatments   Labs (all labs ordered are listed, but only abnormal results are displayed) Labs Reviewed  COMPREHENSIVE METABOLIC PANEL - Abnormal; Notable for the following components:      Result Value   Sodium 133 (*)    Calcium 8.2 (*)    Total Protein 6.4 (*)    AST 13 (*)    All other components within normal limits  CBC WITH DIFFERENTIAL/PLATELET - Abnormal; Notable for the following components:   WBC 12.1 (*)    Monocytes Absolute 1.1 (*)    All other components within normal limits    EKG EKG Interpretation  Date/Time:  Thursday  October 09 2021 19:11:04 EST Ventricular Rate:  87 PR Interval:  114 QRS Duration: 68 QT Interval:  332 QTC Calculation: 399 R  Axis:   72 Text Interpretation: Sinus rhythm with Premature atrial complexes Cannot rule out Anterior infarct , age undetermined No significant change since last tracing Confirmed by Gwyneth Sprout ([id number]) on 10/09/2021 7:45:18 PM  Radiology No results found.  Procedures Procedures   Medications Ordered in ED Medications  gabapentin (NEURONTIN) capsule 200 mg (200 mg Oral Given 10/09/21 2055)  acetaminophen (TYLENOL) tablet 1,000 mg (1,000 mg Oral Given 10/09/21 2055)    ED Course  I have reviewed the triage vital signs and the nursing notes.  Pertinent labs & imaging results that were available during my care of the patient were reviewed by me and considered in my medical decision making (see chart for details).    MDM Rules/Calculators/A&P                          Patient with right antecubital fossa discomfort and some right forearm discomfort symptoms been going on for 3 to 4 months.  Denies any repetitive movements/injuries.  She is not taking anything for her symptoms.  Neurologic exam without abnormality she does seem to have some decrease in sensation subjectively in her right upper extremity but no weakness and is not numb.  Symptoms not consistent with stroke her symptoms are more consistent with peripheral neuropathy  Doubt MS she is not having any ascending symptoms and has no lower extremity symptoms.   She has no history of similar episodes.  No family history of MS.  CMP unremarkable apart from very mild hyponatremia CBC with very mild leukocytosis she is afebrile normal vital signs There is no evidence of cellulitis on examination.  Compartments are soft she is distally neurovascularly intact with good radial artery pulses which are 3+ bilaterally.  I reviewed evaluated her prior to discharge and she states her symptoms are 70%  better after Tylenol and 1 dose of gabapentin  Recommend follow-up with PCP given information for neurology as well.  She may return for any new or concerning symptoms.  Briefly discussed with attending physician prior to discharge.  Final Clinical Impression(s) / ED Diagnoses Final diagnoses:  Right arm pain    Rx / DC Orders ED Discharge Orders          Ordered    gabapentin (NEURONTIN) 100 MG capsule  3 times daily        10/09/21 2142             Solon [place], [place] 10/09/21 2151    Gwyneth Sprout, MD 10/09/21 2228

## 2021-10-09 NOTE — ED Triage Notes (Addendum)
C/o right wrist numbness x 1 month , today x 2 hrs numbness extends to right  shoulder and across chest

## 2021-10-22 ENCOUNTER — Encounter: Payer: Self-pay | Admitting: Neurology

## 2022-01-02 ENCOUNTER — Ambulatory Visit: Payer: 59 | Admitting: Neurology

## 2022-01-02 ENCOUNTER — Encounter: Payer: Self-pay | Admitting: Neurology

## 2022-01-02 ENCOUNTER — Other Ambulatory Visit: Payer: Self-pay

## 2022-01-02 VITALS — BP 107/59 | HR 84 | Ht 59.0 in | Wt 161.0 lb

## 2022-01-02 DIAGNOSIS — R531 Weakness: Secondary | ICD-10-CM | POA: Diagnosis not present

## 2022-01-02 DIAGNOSIS — R202 Paresthesia of skin: Secondary | ICD-10-CM

## 2022-01-02 DIAGNOSIS — R200 Anesthesia of skin: Secondary | ICD-10-CM

## 2022-01-02 NOTE — Patient Instructions (Addendum)
MRI brain without contrast US carotid

## 2022-01-02 NOTE — Progress Notes (Signed)
[facility] Clinic Note - Initial Visit   Date: 01/02/22  Kristen Gonzalez MRN: [id number] DOB: 05-Apr-1976   Dear Dr. Anitra Lauth:  Thank you for your kind referral of Kristen Gonzalez for consultation of right arm pain. Although her history is well known to you, please allow Kristen Gonzalez to reiterate it for the purpose of our medical record. The patient was accompanied to the clinic by self.    History of Present Illness: Kristen Gonzalez is a 46 y.o. right-handed female presenting for evaluation of right side weakness and numbness/tingling.  She went to the ER on 10/09/2021 with right face, arm and leg weakness. Symptoms lasted 3 hours and then resolved. Since this time, she has ongoing intermittent spells numbness/tingling and weakness of the right arm and leg.  It lasts several hours and self-resolves. It is worse in the right arm.  When she feels weak, her arm and/or leg can feel heavy and she also feels face droops. Facial weakness occurs about twice per month, the other weakness and tingling occur about every other day.  There is no specific trigger or pattern to her symptoms.  She has history headaches, but doesnot feel that her current symptoms are associated with headaches. She was evaluated by Dr. Lucia Gaskins at [facility] in 07/2020 for exacerbation of headache following COVID vaccine. At that time, MRI brain wwo contrast was normal.    Out-side paper records, electronic medical record, and images have been reviewed where available and summarized as:  MRI brain wwo contrast 08/27/2020:  normal  Lab Results  Component Value Date   TSH 1.960 08/21/2020   No results found for: ESRSEDRATE, POCTSEDRATE  Past Medical History:  Diagnosis Date   No pertinent past medical history     Past Surgical History:  Procedure Laterality Date   APPENDECTOMY       Medications:  Outpatient Encounter Medications as of 01/02/2022  Medication Sig   IBUPROFEN PO Take by  mouth.   [DISCONTINUED] albuterol (VENTOLIN HFA) 108 (90 Base) MCG/ACT inhaler Inhale 2 puffs into the lungs every 4 (four) hours as needed for wheezing or shortness of breath. (Patient not taking: Reported on 01/02/2022)   [DISCONTINUED] gabapentin (NEURONTIN) 100 MG capsule Take 1-3 capsules (100-300 mg total) by mouth 3 (three) times daily for 10 days. (Patient not taking: Reported on 01/02/2022)   [DISCONTINUED] naproxen (NAPROSYN) 500 MG tablet Take 1 tablet (500 mg total) by mouth 2 (two) times daily. (Patient not taking: Reported on 01/02/2022)   [DISCONTINUED] tinidazole (TINDAMAX) 500 MG tablet Take 1,000 mg by mouth daily. (Patient not taking: Reported on 01/02/2022)   No facility-administered encounter medications on file as of 01/02/2022.    Allergies: No Known Allergies  Family History: Family History  Problem Relation Age of Onset   Hypertension Mother    Diabetes Mother    Migraines Mother    Hypertension Father    Lung cancer Father    Throat cancer Father    Heart disease Father     Social History: Social History   Tobacco Use   Smoking status: Never   Smokeless tobacco: Never  Vaping Use   Vaping Use: Never used  Substance Use Topics   Alcohol use: No   Drug use: No   Social History   Social History Narrative   Lives with ex-husband   Right handed    Caffeine: venti [employer] frap at least 3x per week, 12 oz can of dr pepper every other day  Vital Signs:  BP (!) 107/59    Pulse 84    Ht 4\' 11"  (1.499 m)    Wt 161 lb (73 kg)    SpO2 96%    BMI 32.52 kg/m   Neurological Exam: MENTAL STATUS including orientation to time, place, person, recent and remote memory, attention span and concentration, language, and fund of knowledge is normal.  Speech is not dysarthric.  CRANIAL NERVES: II:  No visual field defects.  III-IV-VI: Pupils equal round and reactive to light.  Normal conjugate, extra-ocular eye movements in all directions of gaze.  No nystagmus.  No  ptosis.   V:  Normal facial sensation.    VII:  Normal facial symmetry and movements.   VIII:  Normal hearing and vestibular function.   IX-X:  Normal palatal movement.   XI:  Normal shoulder shrug and head rotation.   XII:  Normal tongue strength and range of motion, no deviation or fasciculation.  MOTOR:  No atrophy, fasciculations or abnormal movements.  No pronator drift.   Upper Extremity:  Right  Left  Deltoid  5/5   5/5   Biceps  5/5   5/5   Triceps  5/5   5/5   Infraspinatus 5/5  5/5  Medial pectoralis 5/5  5/5  Wrist extensors  5/5   5/5   Wrist flexors  5/5   5/5   Finger extensors  5/5   5/5   Finger flexors  5/5   5/5   Dorsal interossei  5/5   5/5   Abductor pollicis  5/5   5/5   Tone (Ashworth scale)  0  0   Lower Extremity:  Right  Left  Hip flexors  5/5   5/5   Hip extensors  5/5   5/5   Adductor 5/5  5/5  Abductor 5/5  5/5  Knee flexors  5/5   5/5   Knee extensors  5/5   5/5   Dorsiflexors  5/5   5/5   Plantarflexors  5/5   5/5   Toe extensors  5/5   5/5   Toe flexors  5/5   5/5   Tone (Ashworth scale)  0  0   MSRs:  Right        Left                  brachioradialis 2+  2+  biceps 2+  2+  triceps 2+  2+  patellar 2+  2+  ankle jerk 2+  2+  Hoffman no  no  plantar response down  down   SENSORY:  Normal and symmetric perception of light touch, pinprick, vibration, and proprioception.  Romberg's sign absent.   COORDINATION/GAIT: Normal finger-to- nose-finger and heel-to-shin.  Intact rapid alternating movements bilaterally.  Able to rise from a chair without using arms.  Gait narrow based and stable. Tandem and stressed gait intact.    IMPRESSION: Right hemisensory paresthesias and weakness (face, arm, and leg), transient.  No focal findings on neurological exam.  - MRI brain wo contrast and carotids will be ordered to further evaluate her lateralizing symptoms, but my overall suspicion for CNS pathology is low. If imaging is negative, possible  spells are complex migraine   Thank you for allowing me to participate in patient's care.  If I can answer any additional questions, I would be pleased to do so.    Sincerely,    Indica Marcott K. [place], DO

## 2022-01-12 ENCOUNTER — Ambulatory Visit
Admission: RE | Admit: 2022-01-12 | Discharge: 2022-01-12 | Disposition: A | Discharge status: Home / Self Care | Payer: 59 | Source: Ambulatory Visit | Attending: Neurology | Admitting: Neurology

## 2022-01-12 ENCOUNTER — Other Ambulatory Visit: Payer: Self-pay

## 2022-01-12 DIAGNOSIS — R531 Weakness: Secondary | ICD-10-CM

## 2022-01-12 DIAGNOSIS — R200 Anesthesia of skin: Secondary | ICD-10-CM

## 2022-01-19 ENCOUNTER — Telehealth: Payer: Self-pay

## 2022-01-19 MED ORDER — GABAPENTIN 300 MG PO CAPS: 300.0000 mg | ORAL_CAPSULE | Freq: Every evening | ORAL | 0 refills | Status: DC

## 2022-01-19 NOTE — Telephone Encounter (Signed)
-----   Message from Glendale Chard, DO sent at 01/12/2022 10:40 AM EST ----- Please let pt know that her MRI brain is normal - no stroke, bleed, or MS, nothing worrisome to cause her numbness/tingling.  Symptoms may be related to complex migraine.  If symptoms are bothering her enough, we can start daily medication for tingling such as gabapentin 300mg  at bedtime, otherwise monitor.  Thanks.

## 2022-01-20 ENCOUNTER — Telehealth: Payer: Self-pay | Admitting: Neurology

## 2022-01-20 NOTE — Telephone Encounter (Signed)
Patient called and stated she had an MRI. She wanted to know if she could get a copy of her results?  She also wanted to know if her prescription was called in to the pharmacy.

## 2022-01-21 NOTE — Telephone Encounter (Signed)
Called and spoke to patient and she informed her that she would need to contact [facility] to request her MRI results. Patient then informed me that her job was asking for her results to ensure she was ok to work and that she did not have a stroke. I informed patient that per HIPPA she does not need to provide them those MRI results as it is a violation of her privacy. I explained to patient that if she needed a letter to inform her job that she was ok to work, then we can ask Dr. Posey Pronto for that. Patient agreed that she rather have a letter written for her job. Patient was advised that Gabapentin was called in on 01/19/22 and if she had any issues with the pharmacy to let me know. ? ?Patient is aware that I will send this message to Dr. Posey Pronto for a letter and call her once it is completed. Patient verbalized understanding and had no further question or concerns.  ?

## 2022-01-23 NOTE — Telephone Encounter (Signed)
Letter ready.  For MRI results, she may sign onto MyChart or request through medical records.  ?

## 2022-01-23 NOTE — Telephone Encounter (Signed)
Called patient and informed her that her letter is ready for pickup. Patient will come by the office to pick up letter. Patient was also informed that she may go onto Mychart for her MRI results or request through Medical Records. Patient verbalized understanding and had no further questions or concerns.  ? ?Letter has been placed up front for pick up. ?

## 2022-05-19 IMAGING — MR MR HEAD W/O CM
10 series · 48 of 48 positions shown · non-contrast
Comparison: Brain MRI 08/27/2020.  Head CT 05/24/2012.

CLINICAL DATA: Provided history: Right facial numbness. Numbness
and tingling of right arm and leg. Right-sided weakness. Right-sided
numbness. Additional history provided by scanning technologist:
Patient reports right-sided body weakness for 4 months.

EXAM:
MRI HEAD WITHOUT CONTRAST
TECHNIQUE: Multiplanar, multiecho pulse sequences of the brain and surrounding
structures were obtained without intravenous contrast.

[Series 2: T1 · sagittal · 5.0mm · 0.45mm/px · 3 of 25 slices shown]
[im 1/25]
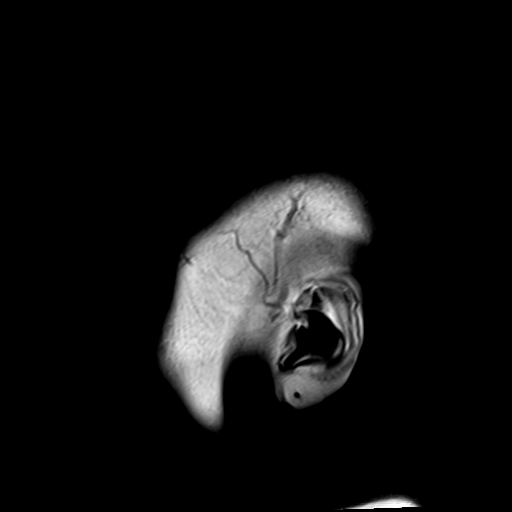
[im 13/25]
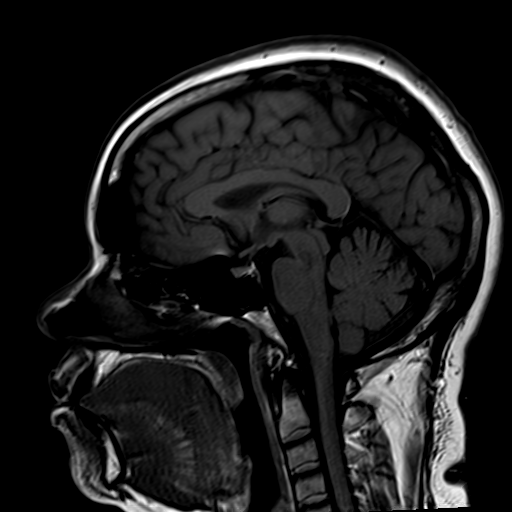
[im 25/25]
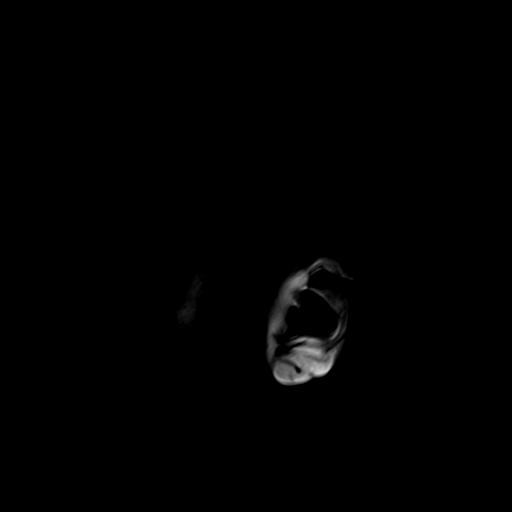

[Series 3: ax ep2d_diff_3 · axial · 3.0mm · 1.80mm/px · z∈[-40,+122]mm · 8 of 107 slices shown]
[im 1/107]
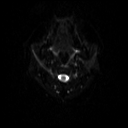
[im 16/107]
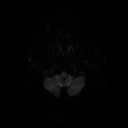
[im 31/107]
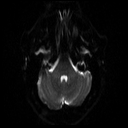
[im 46/107]
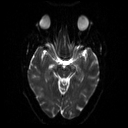
[im 61/107]
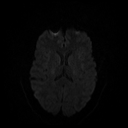
[im 76/107]
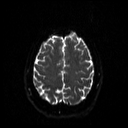
[im 91/107]
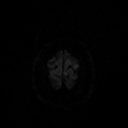
[im 107/107]
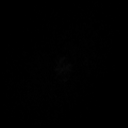

[Series 4: ax ep2d_diff_3_adc · axial · 3.0mm · 1.80mm/px · z∈[-40,+122]mm · 4 of 55 slices shown]
[im 1/55]
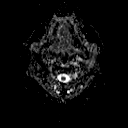
[im 19/55]
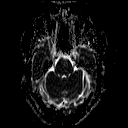
[im 37/55]
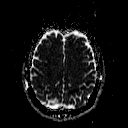
[im 55/55]
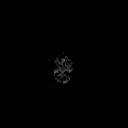

[Series 5: cor ep2d_diff · coronal · 5.0mm · 1.77mm/px · 5 of 59 slices shown]
[im 1/59]
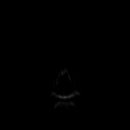
[im 15/59]
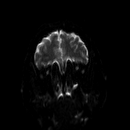
[im 30/59]
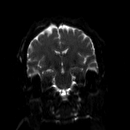
[im 44/59]
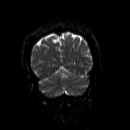
[im 59/59]
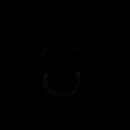

[Series 6: cor ep2d_diff_adc · coronal · 5.0mm · 1.77mm/px · 2 of 30 slices shown]
[im 1/30]
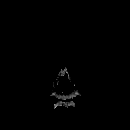
[im 30/30]
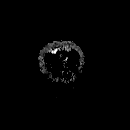

[Series 8: swi_images · axial · 2.0mm · 0.98mm/px · z∈[-36,+121]mm · 6 of 80 slices shown]
[im 1/80]
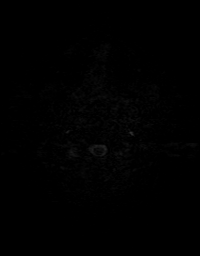
[im 16/80]
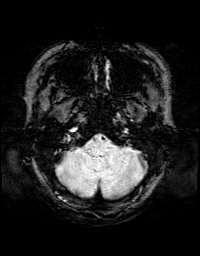
[im 32/80]
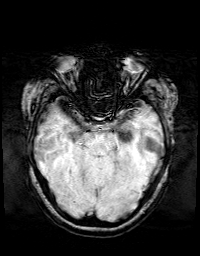
[im 48/80]
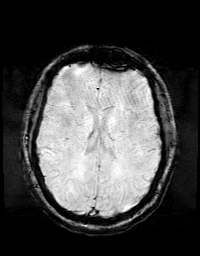
[im 64/80]
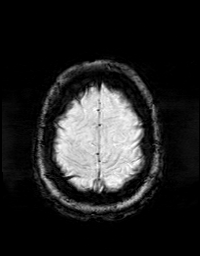
[im 80/80]
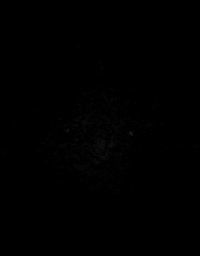

[Series 9: FLAIR · axial · 3.0mm · 0.43mm/px · z∈[-34,+117]mm · 3 of 40 slices shown]
[im 1/40]
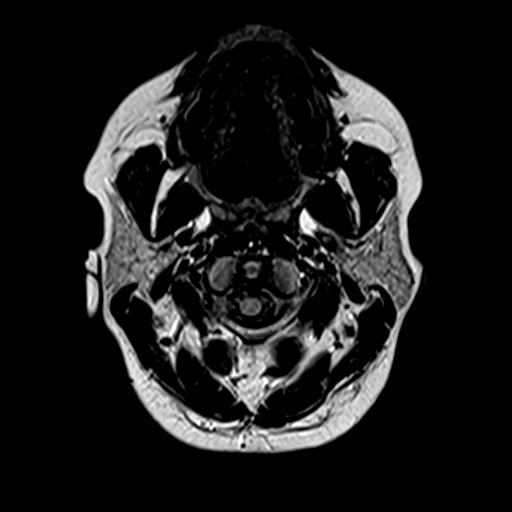
[im 20/40]
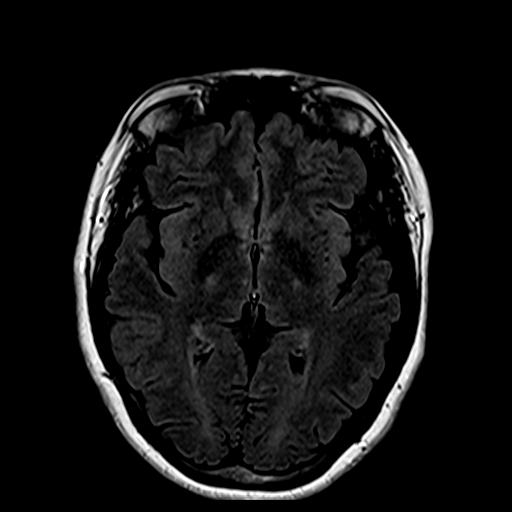
[im 40/40]
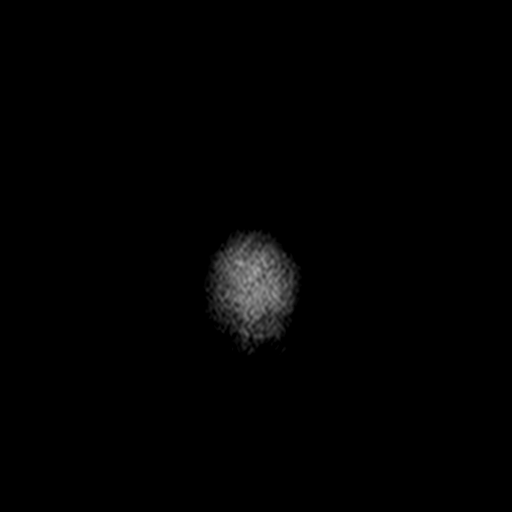

[Series 10: T2 · axial · 5.0mm · 0.65mm/px · z∈[-41,+126]mm · 2 of 29 slices shown (1 of 2)]
[im 1/29]
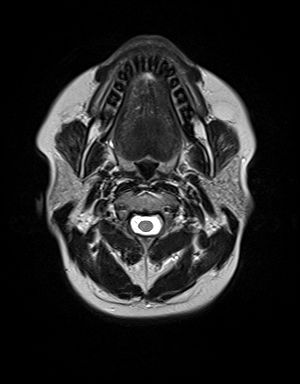
[im 29/29]
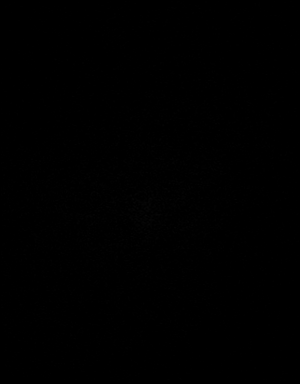

[Series 11: t1_mpr_tra · axial · 1.0mm · 0.72mm/px · z∈[-36,+122]mm · 13 of 160 slices shown]
[im 1/160]
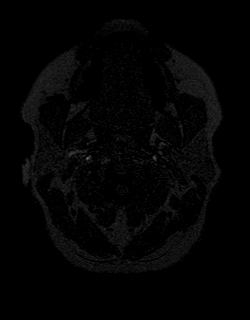
[im 14/160]
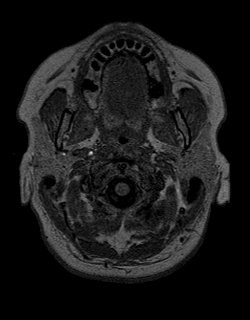
[im 27/160]
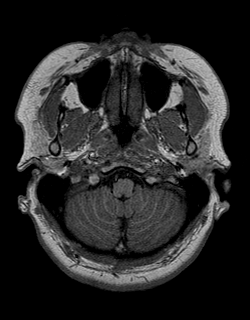
[im 40/160]
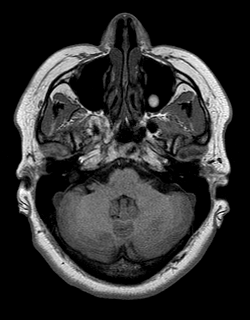
[im 54/160]
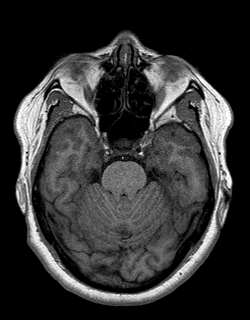
[im 67/160]
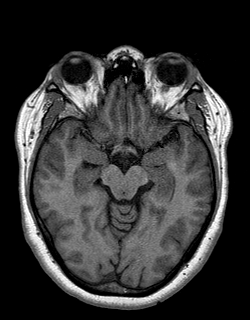
[im 80/160]
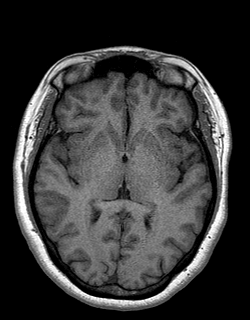
[im 93/160]
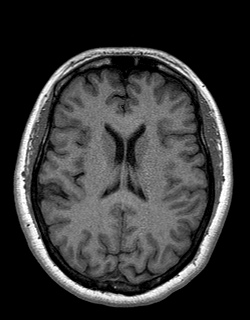
[im 107/160]
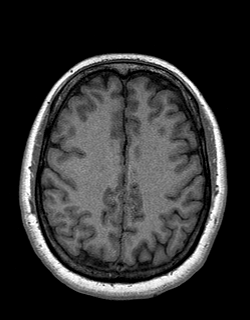
[im 120/160]
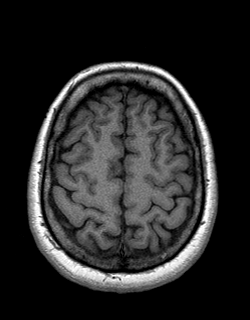
[im 133/160]
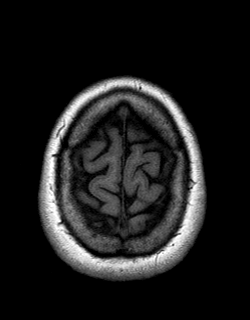
[im 146/160]
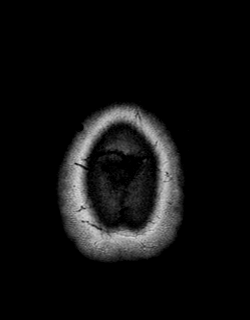
[im 160/160]
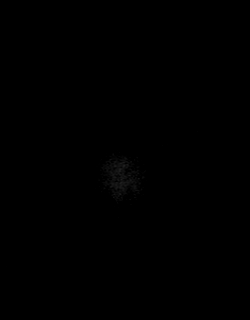

[Series 12: T2 · coronal · 5.0mm · 0.43mm/px · 2 of 28 slices shown (2 of 2)]
[im 1/28]
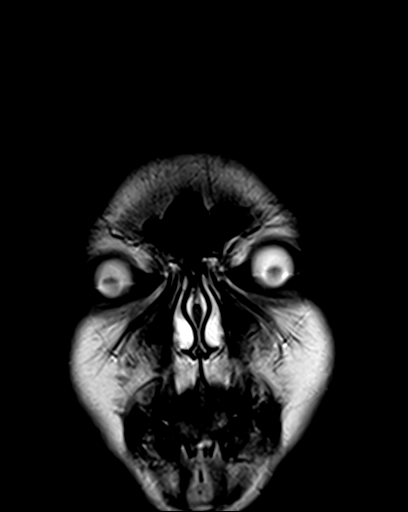
[im 28/28]
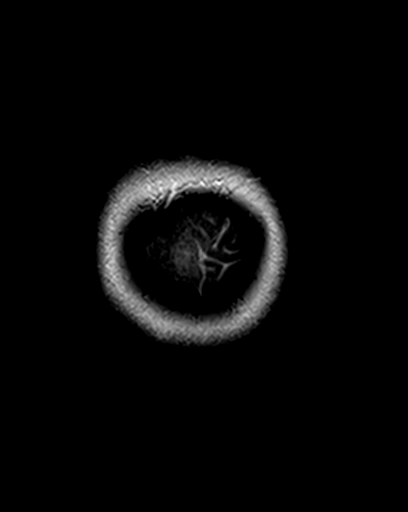

[48 of 48 positions shown; findings below may reference images not displayed]

FINDINGS: Brain:

Cerebral volume is normal.

No cortical encephalomalacia is identified. No significant cerebral
white matter disease.

There is no acute infarct.

No evidence of an intracranial mass.

No chronic intracranial blood products.

No extra-axial fluid collection.

No midline shift.

Vascular: Maintained flow voids within the proximal large arterial
vessels.

Skull and upper cervical spine: No focal suspicious marrow lesion.

Sinuses/Orbits: Visualized orbits show no acute finding. 13 mm
mucous retention cyst within the left maxillary sinus. Trace mucosal
thickening within the right maxillary, bilateral sphenoid and
bilateral ethmoid sinuses.
IMPRESSION: Unremarkable non-contrast MRI appearance of the brain. No evidence
of acute intracranial abnormality.

Paranasal sinus disease, as described.

## 2023-02-27 ENCOUNTER — Emergency Department (HOSPITAL_BASED_OUTPATIENT_CLINIC_OR_DEPARTMENT_OTHER): Payer: 59

## 2023-02-27 ENCOUNTER — Emergency Department (HOSPITAL_BASED_OUTPATIENT_CLINIC_OR_DEPARTMENT_OTHER)
Admission: EM | Admit: 2023-02-27 | Discharge: 2023-02-27 | Disposition: A | Discharge status: Home / Self Care | Payer: 59 | Attending: Emergency Medicine | Admitting: Emergency Medicine

## 2023-02-27 ENCOUNTER — Other Ambulatory Visit: Payer: Self-pay

## 2023-02-27 DIAGNOSIS — R0602 Shortness of breath: Secondary | ICD-10-CM | POA: Diagnosis not present

## 2023-02-27 DIAGNOSIS — K449 Diaphragmatic hernia without obstruction or gangrene: Secondary | ICD-10-CM | POA: Insufficient documentation

## 2023-02-27 DIAGNOSIS — R519 Headache, unspecified: Secondary | ICD-10-CM | POA: Insufficient documentation

## 2023-02-27 DIAGNOSIS — R110 Nausea: Secondary | ICD-10-CM | POA: Insufficient documentation

## 2023-02-27 DIAGNOSIS — R0789 Other chest pain: Secondary | ICD-10-CM

## 2023-02-27 DIAGNOSIS — R079 Chest pain, unspecified: Secondary | ICD-10-CM | POA: Diagnosis present

## 2023-02-27 LAB — BASIC METABOLIC PANEL
Anion gap: 4 — ABNORMAL LOW (ref 5–15)
BUN: 10 mg/dL (ref 6–20)
CO2: 26 mmol/L (ref 22–32)
Calcium: 8.4 mg/dL — ABNORMAL LOW (ref 8.9–10.3)
Chloride: 105 mmol/L (ref 98–111)
Creatinine, Ser: 1.01 mg/dL — ABNORMAL HIGH (ref 0.44–1.00)
GFR, Estimated: >60 mL/min (ref >60)
Glucose, Bld: 100 mg/dL — ABNORMAL HIGH (ref 70–99)
Potassium: 3.6 mmol/L (ref 3.5–5.1)
Sodium: 135 mmol/L (ref 135–145)

## 2023-02-27 LAB — CBC
HCT: 37.7 % (ref 36.0–46.0)
Hemoglobin: 12.2 g/dL (ref 12.0–15.0)
MCH: 28.2 pg (ref 26.0–34.0)
MCHC: 32.4 g/dL (ref 30.0–36.0)
MCV: 87.1 fL (ref 80.0–100.0)
Platelets: 271 10*3/uL (ref 150–400)
RBC: 4.33 MIL/uL (ref 3.87–5.11)
RDW: 14.9 % (ref 11.5–15.5)
WBC: 11.5 10*3/uL — ABNORMAL HIGH (ref 4.0–10.5)
nRBC: 0 % (ref 0.0–0.2)

## 2023-02-27 LAB — TROPONIN I (HIGH SENSITIVITY)
Troponin I (High Sensitivity): 2 ng/L (ref <18)
Troponin I (High Sensitivity): <2 ng/L (ref <18)

## 2023-02-27 LAB — HCG, SERUM, QUALITATIVE: Preg, Serum: NEGATIVE

## 2023-02-27 MED ORDER — IOHEXOL 350 MG/ML SOLN
100.0000 mL | Freq: Once | INTRAVENOUS | Status: AC | PRN
  Administered 2023-02-27: 100 mL via INTRAVENOUS

## 2023-02-27 MED ORDER — FENTANYL CITRATE PF 50 MCG/ML IJ SOSY
50.0000 ug | PREFILLED_SYRINGE | Freq: Once | INTRAMUSCULAR | Status: AC
  Administered 2023-02-27: 50 ug via INTRAVENOUS
  Filled 2023-02-27: qty 1

## 2023-02-27 MED ORDER — SODIUM CHLORIDE 0.9 % IV BOLUS
500.0000 mL | Freq: Once | INTRAVENOUS | Status: AC
Start: 2023-02-27 — End: 2023-02-27
  Administered 2023-02-27: 500 mL via INTRAVENOUS

## 2023-02-27 NOTE — ED Triage Notes (Signed)
Pt presents with CP x 2 weeks that pt attributed to heart burn. Pt states the pain suddenly worsened today when it woke her from her sleep. Pt describes the pain as a pressure that radiates into L arm with associated ShOB. Pt reports she has been taking "heart burn" medication and temporarily alleviated the pain.

## 2023-02-27 NOTE — Discharge Instructions (Addendum)
Your laboratory results were within normal limits today.  We discussed appropriate follow-up with primary care of this continued pain.  If you experience any worsening symptoms you may return to the emergency department.

## 2023-02-27 NOTE — ED Provider Notes (Signed)
[place] [facility]ERGENCY DEPARTMENT AT [facility] Provider Note   CSN: [id number] Arrival date & time: 02/27/23  1251     History  Chief Complaint  Patient presents with   Chest Pain    Kristen Gonzalez is a 47 y.o. female.  47 y.o female with no PMH presents to the ED with a chief complaint of chest pain that is been ongoing for the past 2 weeks.  She was attributing this to heartburn, she describes the pain along the substernal area, stabbing nature radiating down her left arm.  Today she felt that the pain worsened, she also began to have a headache, describing it to the posterior aspect of the right side of her head.  She does report her vision was somewhat blurry this morning but this has now subsided.  She has been having some nausea, but feels like she has to spit back up.  The pain is exacerbated with any movement along with after meals.  She does report recording her blood pressure with a systolic in the 200s, did take a spoonful of vinegar and reports her blood pressure then normalized.  She feels short of breath, this occurs at rest, she is concerned for cardiac etiology.  She did have a father who died of an MI under the age of 42.  She denies any prior history of CAD, no fever, no leg swelling, no tobacco use.  The history is provided by the patient.  Chest Pain Pain location:  Substernal area Pain quality: stabbing   Pain radiates to:  L arm Pain severity:  Severe Onset quality:  Sudden Duration:  1 month Timing:  Intermittent Progression:  Worsening Chronicity:  New Context: at rest   Relieved by:  Nothing Worsened by:  Movement Ineffective treatments:  None tried Associated symptoms: headache and shortness of breath   Associated symptoms: no abdominal pain, no back pain, no fever, no nausea and no vomiting   Risk factors: no coronary artery disease, no diabetes mellitus, not female, no prior DVT/PE and no smoking        Home Medications Prior to  Admission medications   Medication Sig Start Date End Date Taking? Authorizing Provider  gabapentin (NEURONTIN) 300 MG capsule Take 1 capsule (300 mg total) by mouth at bedtime. 01/19/22   Nita Sickle K, DO  IBUPROFEN PO Take by mouth.    [provider]      Allergies    Patient has no known allergies.    Review of Systems   Review of Systems  Constitutional:  Negative for chills and fever.  HENT:  Negative for sore throat.   Respiratory:  Positive for shortness of breath.   Cardiovascular:  Positive for chest pain. Negative for leg swelling.  Gastrointestinal:  Negative for abdominal pain, nausea and vomiting.  Genitourinary:  Negative for flank pain.  Musculoskeletal:  Negative for back pain.  Neurological:  Positive for headaches.  All other systems reviewed and are negative.   Physical Exam Updated Vital Signs BP 113/61   Pulse 78   Temp 98.3 F (36.8 C) (Oral)   Resp 19   Ht 4\' 11"  (1.499 m)   Wt 74.4 kg   LMP 01/28/2023   SpO2 100%   BMI 33.12 kg/m  Physical Exam Vitals and nursing note reviewed.  Constitutional:      Appearance: She is well-developed.  HENT:     Head: Normocephalic and atraumatic.  Cardiovascular:     Rate and Rhythm:  Normal rate.     Pulses:          Radial pulses are 2+ on the right side and 2+ on the left side.       Posterior tibial pulses are 2+ on the right side and 2+ on the left side.  Pulmonary:     Effort: Pulmonary effort is normal.     Breath sounds: Normal breath sounds.  Chest:     Chest wall: No tenderness.  Abdominal:     Palpations: Abdomen is soft.  Musculoskeletal:     Right lower leg: No edema.     Left lower leg: No edema.  Skin:    General: Skin is warm and dry.  Neurological:     Mental Status: She is alert.     Comments: Moves upper and lower extremities.  No facial asymmetry, no dysarthria, no focal weakness noted on my exam.     ED Results / Procedures / Treatments   Labs (all labs ordered  are listed, but only abnormal results are displayed) Labs Reviewed  BASIC METABOLIC PANEL - Abnormal; Notable for the following components:      Result Value   Glucose, Bld 100 (*)    Creatinine, Ser 1.01 (*)    Calcium 8.4 (*)    Anion gap 4 (*)    All other components within normal limits  CBC - Abnormal; Notable for the following components:   WBC 11.5 (*)    All other components within normal limits  HCG, SERUM, QUALITATIVE  TROPONIN I (HIGH SENSITIVITY)  TROPONIN I (HIGH SENSITIVITY)    EKG EKG Interpretation  Date/Time:  Saturday February 27 2023 13:59:52 EDT Ventricular Rate:  72 PR Interval:  105 QRS Duration: 78 QT Interval:  375 QTC Calculation: 411 R Axis:   46 Text Interpretation: Sinus rhythm Short PR interval Borderline T abnormalities, diffuse leads nonspecific T waves similar to earlier in the day Confirmed by Pricilla Loveless [phone]) on 02/27/2023 2:05:10 PM  Radiology CT Angio Chest/Abd/Pel for Dissection W and/or W/WO  Result Date: 02/27/2023 CLINICAL DATA:  Chest pain EXAM: CT ANGIOGRAPHY CHEST, ABDOMEN AND PELVIS TECHNIQUE: Noncontrast CT of the chest was initially performed. Multidetector CT imaging through the chest, abdomen and pelvis was performed using the standard protocol during bolus administration of intravenous contrast. Multiplanar reconstructed images and MIPs were obtained and reviewed to evaluate the vascular anatomy. RADIATION DOSE REDUCTION: This exam was performed according to the departmental dose-optimization program which includes automated exposure control, adjustment of the mA and/or kV according to patient size and/or use of iterative reconstruction technique. CONTRAST:  OMNIPAQUE IOHEXOL 350 MG/ML SOLN COMPARISON:  None Available. FINDINGS: CTA CHEST FINDINGS Cardiovascular: Noncontrast CT of the chest demonstrates no evidence of an aortic intramural hematoma. Preferential opacification of the thoracic aorta. No evidence of thoracic aortic  aneurysm or dissection. Common origin of the brachiocephalic and left common carotid arteries, a anatomic variant. Central pulmonary vasculature is also well opacified. No central pulmonary arterial filling defects. Normal heart size. No pericardial effusion. Mediastinum/Nodes: No enlarged mediastinal, hilar, or axillary lymph nodes. Thyroid gland, trachea, and esophagus demonstrate no significant findings. Moderate hiatal hernia. Lungs/Pleura: Lungs are clear. No pleural effusion or pneumothorax. Musculoskeletal: No chest wall abnormality. Dextroscoliotic thoracic curvature. No acute or significant osseous findings. Review of the MIP images confirms the above findings. CTA ABDOMEN AND PELVIS FINDINGS VASCULAR Aorta: Normal caliber aorta without aneurysm, dissection, vasculitis or significant stenosis. Celiac: Patent without evidence of aneurysm, dissection, vasculitis  or significant stenosis. SMA: Patent without evidence of aneurysm, dissection, vasculitis or significant stenosis. Renals: Both renal arteries are patent without evidence of aneurysm, dissection, vasculitis, fibromuscular dysplasia or significant stenosis. IMA: Patent. Inflow: Patent without evidence of aneurysm, dissection, vasculitis or significant stenosis. Veins: No obvious venous abnormality within the limitations of this arterial phase study. Review of the MIP images confirms the above findings. NON-VASCULAR Hepatobiliary: No focal liver abnormality is seen. No gallstones, gallbladder wall thickening, or biliary dilatation. Pancreas: Unremarkable. No pancreatic ductal dilatation or surrounding inflammatory changes. Spleen: Normal in size without focal abnormality. Adrenals/Urinary Tract: Adrenal glands are unremarkable. Kidneys are normal, without renal calculi, focal lesion, or hydronephrosis. Bladder is unremarkable. Stomach/Bowel: Moderate hiatal hernia. No abnormally dilated loops of bowel. Long segment mild wall thickening of the ascending  and proximal transverse colon. Appendix is not visualized. Lymphatic: No abnormally enlarged abdominal or pelvic lymph nodes. Reproductive: Uterus and bilateral adnexa are unremarkable. Other: No free fluid. No abdominopelvic fluid collection. No pneumoperitoneum. No abdominal wall hernia. Musculoskeletal: No acute or significant osseous findings. Review of the MIP images confirms the above findings. IMPRESSION: 1. No evidence of aortic aneurysm or dissection. 2. Long segment mild wall thickening of the ascending and proximal transverse colon, suggestive of an infectious or inflammatory colitis. 3. Moderate hiatal hernia. Electronically Signed   By: Duanne Guess D.O.   On: 02/27/2023 15:29   DG Chest 2 View  Result Date: 02/27/2023 CLINICAL DATA:  Provided history: Chest pain. EXAM: CHEST - 2 VIEW COMPARISON:  Prior chest radiograph 02/09/2021 and earlier. FINDINGS: Heart size within normal limits. Probable hiatal hernia. No appreciable airspace consolidation. No evidence of pleural effusion or pneumothorax. No acute osseous abnormality identified. Dextrocurvature of the thoracic spine IMPRESSION: 1.  No evidence of acute cardiopulmonary abnormality. 2. Probable hiatal hernia. 3. Dextrocurvature of the thoracic spine Electronically Signed   By: Jackey Loge D.O.   On: 02/27/2023 13:32    Procedures Procedures    Medications Ordered in ED Medications  sodium chloride 0.9 % bolus 500 mL (500 mLs Intravenous New Bag/Given 02/27/23 1427)  fentaNYL (SUBLIMAZE) injection 50 mcg (50 mcg Intravenous Given 02/27/23 1427)  iohexol (OMNIPAQUE) 350 MG/ML injection 100 mL (100 mLs Intravenous Contrast Given 02/27/23 1448)    ED Course/ Medical Decision Making/ A&P                             Medical Decision Making Amount and/or Complexity of Data Reviewed Labs: ordered. Radiology: ordered.  Risk Prescription drug management.    This patient presents to the ED for concern of chest pain, this involves  a number of treatment options, and is a complaint that carries with it a high risk of complications and morbidity.  The differential diagnosis includes ACS, PE, viral illness versus MSK.    Co morbidities: Discussed in HPI   Brief History:  See HPI.   EMR reviewed including pt PMHx, past surgical history and past visits to ER.   See HPI for more details   Lab Tests:  I ordered and independently interpreted labs.  The pertinent results include:    I personally reviewed all laboratory work and imaging. Metabolic panel without any acute abnormality specifically kidney function within normal limits and no significant electrolyte abnormalities. CBC without leukocytosis or significant anemia.  Slight elevation of her creatinine, provided with fluids to help with her symptoms.   Imaging Studies:  NAD. I personally reviewed all  imaging studies and no acute abnormality found. I agree with radiology interpretation.  CT Angio chest: IMPRESSION:  1. No evidence of aortic aneurysm or dissection.  2. Long segment mild wall thickening of the ascending and proximal  transverse colon, suggestive of an infectious or inflammatory  colitis.  3. Moderate hiatal hernia.   Cardiac Monitoring:  The patient was maintained on a cardiac monitor.  I personally viewed and interpreted the cardiac monitored which showed an underlying rhythm of: NSR 72 EKG non-ischemic   Medicines ordered:  I ordered medication including fentanyl  for pain control Reevaluation of the patient after these medicines showed that the patient improved I have reviewed the patients home medicines and have made adjustments as needed  Reevaluation:  After the interventions noted above I re-evaluated patient and found that they have :improved   Social Determinants of Health:  The patient's social determinants of health were a factor in the care of this patient    Problem List / ED Course:  Patient with chest pain  that has been ongoing for about a month, felt pain was worse for arriving into the emergency department.  She has been taking medication for heartburn, however felt that this was not well-controlled.  She did have an episode of nausea and vomiting prior to arrival in the ED.  She is concerned for any cardiac etiology.  As her father did pass from a heart attack prior to the age of 46 from either cancer or heart attack.  She does report the pain that woke her up from her sleep. Labs obtained on today's visit show a CBC with a slight leukocytosis, hemoglobin without any signs of anemia.  BMP with no electrolyte derangement, creatinine level slightly elevated.  Pregnancy test is negative.  Troponin first 1 was negative.  EKG with some ST changes but nonischemic changes.  She is reporting improvement in symptoms after fentanyl, also given half liter of normal saline to help with improvement of her creatinine.  Chest x-ray did not show any pneumonia, no pleural effusion, no acute findings. She did voice some headaches, with some blurry vision when this chest pain occurred, some suspicion for dissection, therefore CT angio was ordered which did not show any acute findings aside from some slight colitis on CT. I discussed these results with patient.  There was incidental finding of hiatal hernia on her chest x-ray, I did discuss appropriate follow-up with primary care physician if this is worsening her heartburn symptoms.  Delta Theodis [place] was obtained without any changes. There is no hypoxia, no tachycardia, no prior history of blood clots I doubt pulmonary embolism. HEART SCORE 2 Patient was observed in the emergency department for approximately 3-1/2 hours, there was no changes in her vitals, no change in her pain.  She remains hemodynamically stable.  Suspicion for acute thoracic pathology at this time.  We discussed treatment of her colitis with increasing fluids, will return to the ED if she has any worsening  symptoms.  Patient hemodynamically stable for discharge.  Dispostion:  After consideration of the diagnostic results and the patients response to treatment, I feel that the patent would benefit from outpatient follow up with PCP.     Portions of this note were generated with Scientist, clinical (histocompatibility and immunogenetics)Dragon dictation software. Dictation errors may occur despite best attempts at proofreading.  Final Clinical Impression(s) / ED Diagnoses Final diagnoses:  Atypical chest pain    Rx / DC Orders ED Discharge Orders     None  Claude Manges, PA-C 02/27/23 1633    Pricilla Loveless, MD 03/01/23 [phone]

## 2023-04-07 ENCOUNTER — Ambulatory Visit
Admission: EM | Admit: 2023-04-07 | Discharge: 2023-04-07 | Disposition: A | Discharge status: Home / Self Care | Payer: 59 | Attending: Family Medicine | Admitting: Family Medicine

## 2023-04-07 DIAGNOSIS — J069 Acute upper respiratory infection, unspecified: Secondary | ICD-10-CM | POA: Diagnosis not present

## 2023-04-07 MED ORDER — PROMETHAZINE-DM 6.25-15 MG/5ML PO SYRP
5.0000 mL | ORAL_SOLUTION | Freq: Four times a day (QID) | ORAL | 0 refills | Status: AC | PRN
Start: 2023-04-07 — End: ?

## 2023-04-07 NOTE — ED Provider Notes (Signed)
  [facility]   [id number] 04/07/23 Arrival Time: 1043  ASSESSMENT & PLAN:  1. Viral URI with cough    Discussed typical duration of likely viral illness. OTC symptom care as needed.  Discharge Medication List as of 04/07/2023 12:10 PM     START taking these medications   Details  promethazine-dextromethorphan (PROMETHAZINE-DM) 6.25-15 MG/5ML syrup Take 5 mLs by mouth 4 (four) times daily as needed for cough., Starting Wed 04/07/2023, Normal         Follow-up Information     [facility]   Specialty: Urgent Care Why: As needed. Contact information: [address] [phone] mc [place] [address] [phone]                Reviewed expectations re: course of current medical issues. Questions answered. Outlined signs and symptoms indicating need for more acute intervention. Understanding verbalized. After Visit Summary given.   SUBJECTIVE: History from: Patient. Kristen Gonzalez is a 47 y.o. female. Pt c/o nasal congestion and drainage, sore throat, headache. Abrupt onset; x 48 hours. Denies fever/SOB.  OBJECTIVE:  Vitals:   04/07/23 1158  BP: 117/69  Pulse: 89  Resp: 16  Temp: 98.4 F (36.9 C)  TempSrc: Oral  SpO2: 97%    General appearance: alert; no distress Eyes: PERRLA; EOMI; conjunctiva normal HENT: [place]; AT; with nasal congestion Neck: supple  Lungs: speaks full sentences without difficulty; unlabored; dry cough; ctab Extremities: no edema Skin: warm and dry Neurologic: normal gait Psychological: alert and cooperative; normal mood and affect  No Known Allergies  Past Medical History:  Diagnosis Date   No pertinent past medical history    Social History   Socioeconomic History   Marital status: Married    Spouse name: Not on file   Number of children: 2   Years of education: Not on file   Highest education level: Not on file  Occupational  History   Not on file  Tobacco Use   Smoking status: Never   Smokeless tobacco: Never  Vaping Use   Vaping Use: Never used  Substance and Sexual Activity   Alcohol use: No   Drug use: No   Sexual activity: Yes  Other Topics Concern   Not on file  Social History Narrative   Lives with ex-husband   Right handed    Caffeine: venti starbucks frap at least 3x per week, 12 oz can of dr pepper every other day   Lives in a two story home    Social Determinants of Health   Financial Resource Strain: Not on file  Food Insecurity: Not on file  Transportation Needs: Not on file  Physical Activity: Not on file  Stress: Not on file  Social Connections: Not on file  Intimate Partner Violence: Not on file   Family History  Problem Relation Age of Onset   Hypertension Mother    Diabetes Mother    Migraines Mother    Hypertension Father    Lung cancer Father    Throat cancer Father    Heart disease Father    Past Surgical History:  Procedure Laterality Date   APPENDECTOMY       Mardella Layman, MD 04/07/23 1313

## 2023-04-07 NOTE — ED Triage Notes (Signed)
Pt c/o nasal congestion and drainage, sore throat, headache   Onset ~ Monday   Reports renovating a home and exposure to mold

## 2024-02-12 ENCOUNTER — Other Ambulatory Visit: Payer: Self-pay

## 2024-02-12 ENCOUNTER — Encounter: Payer: Self-pay | Admitting: *Deleted

## 2024-02-12 ENCOUNTER — Ambulatory Visit: Admission: EM | Admit: 2024-02-12 | Discharge: 2024-02-12 | Disposition: A | Discharge status: Home / Self Care

## 2024-02-12 DIAGNOSIS — J101 Influenza due to other identified influenza virus with other respiratory manifestations: Secondary | ICD-10-CM

## 2024-02-12 LAB — POC COVID19/FLU A&B COMBO
Covid Antigen, POC: NEGATIVE
Influenza A Antigen, POC: NEGATIVE
Influenza B Antigen, POC: POSITIVE — AB

## 2024-02-12 NOTE — Discharge Instructions (Signed)
 You have the flu.  Ensure adequate fluids and rest as we discussed.  Follow-up if any symptoms persist or worsen.

## 2024-02-12 NOTE — ED Provider Notes (Signed)
 [facility]    CSN: [id number] Arrival date & time: 02/12/24  1359      History   Chief Complaint Chief Complaint  Patient presents with   Cough   Fever    HPI Kristen Gonzalez is a 48 y.o. female.   Patient presents with nasal congestion and cough that started 4 days ago.  Reports that she developed a fever 2 days ago with Tmax being 104.  Has taken Tylenol for symptoms.  Her son has had similar symptoms.  Denies history of asthma or COPD.   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 Authorizing Provider  pantoprazole (PROTONIX) 40 MG tablet Take 40 mg by mouth every morning.   Yes [provider]  IBUPROFEN PO Take by mouth. Patient not taking: Reported on 02/12/2024    [provider]  promethazine-dextromethorphan (PROMETHAZINE-DM) 6.25-15 MG/5ML syrup Take 5 mLs by mouth 4 (four) times daily as needed for cough. Patient not taking: Reported on 02/12/2024 04/07/23   Mardella Layman, MD    Family History Family History  Problem Relation Age of Onset   Hypertension Mother    Diabetes Mother    Migraines Mother    Hypertension Father    Lung cancer Father    Throat cancer Father    Heart disease Father     Social History Social History   Tobacco Use   Smoking status: Never   Smokeless tobacco: Never  Vaping Use   Vaping status: Never Used   Substance Use Topics   Alcohol use: No   Drug use: No     Allergies   Patient has no known allergies.   Review of Systems Review of Systems Per HPI  Physical Exam Triage Vital Signs ED Triage Vitals  Encounter Vitals Group     BP 02/12/24 1438 112/77     Systolic BP Percentile --      Diastolic BP Percentile --      Pulse Rate 02/12/24 1438 96     Resp 02/12/24 1438 18     Temp 02/12/24 1435 98.5 F (36.9 C)     Temp Source 02/12/24 1435 Oral     SpO2 02/12/24 1438 96 %     Weight --      Height --      Head Circumference --      Peak Flow --      Pain Score 02/12/24 1438 10     Pain Loc --      Pain Education --      Exclude from Growth Chart --  No data found.  Updated Vital Signs BP 112/77 (BP Location: Right Arm)   Pulse 96   Temp 98.5 F (36.9 C) (Oral)   Resp 18   LMP 02/11/2024   SpO2 96%   Visual Acuity Right Eye Distance:   Left Eye Distance:   Bilateral Distance:    Right Eye Near:   Left Eye Near:    Bilateral Near:     Physical Exam Constitutional:      General: She is not in acute distress.    Appearance: Normal appearance. She is not toxic-appearing or diaphoretic.  HENT:     Head: Normocephalic and atraumatic.     Right Ear: Tympanic membrane and ear canal normal.     Left Ear: Tympanic membrane and ear canal normal.     Nose: Congestion present.     Mouth/Throat:     Mouth: Mucous membranes are moist.     Pharynx: No posterior oropharyngeal erythema.  Eyes:     Extraocular Movements: Extraocular movements intact.     Conjunctiva/sclera: Conjunctivae normal.     Pupils: Pupils are equal, round, and reactive to light.  Cardiovascular:     Rate and Rhythm: Normal rate and regular rhythm.     Pulses: Normal pulses.     Heart sounds: Normal heart sounds.  Pulmonary:     Effort: Pulmonary effort is normal. No respiratory distress.     Breath sounds: Normal breath sounds. No stridor. No wheezing, rhonchi or rales.  Abdominal:      General: Abdomen is flat. Bowel sounds are normal.     Palpations: Abdomen is soft.  Musculoskeletal:        General: Normal range of motion.     Cervical back: Normal range of motion.  Skin:    General: Skin is warm and dry.  Neurological:     General: No focal deficit present.     Mental Status: She is alert and oriented to person, place, and time. Mental status is at baseline.  Psychiatric:        Mood and Affect: Mood normal.        Behavior: Behavior normal.      UC Treatments / Results  Labs (all labs ordered are listed, but only abnormal results are displayed) Labs Reviewed  POC COVID19/FLU A&B COMBO - Abnormal; Notable for the following components:      Result Value   Influenza B Antigen, POC Positive (*)    All other components within normal limits    EKG   Radiology No results found.  Procedures Procedures (including critical care time)  Medications Ordered in UC Medications - No data to display  Initial Impression / Assessment and Plan / UC Course  I have reviewed the triage vital signs and the nursing notes.  Pertinent labs & imaging results that were available during my care of the patient were reviewed by me and considered in my medical decision making (see chart for details).     Patient tested positive for influenza B.  Patient is outside the treatment window for Tamiflu.  Therefore, advised adequate fluids, rest, supportive care, symptom management with over-the-counter medications.  Advised strict follow-up precautions.  Patient verbalized understanding and was agreeable with plan. Final Clinical Impressions(s) / UC Diagnoses   Final diagnoses:  Influenza B     Discharge Instructions      You have the flu.  Ensure adequate fluids and rest as we discussed.  Follow-up if any symptoms persist or worsen.  ED Prescriptions   None    PDMP not reviewed this encounter.   Gustavus Bryant, [place] 02/12/24 [phone]

## 2024-02-12 NOTE — ED Triage Notes (Signed)
 Cough and cold sx x 4 days. Fever x 2 days. Reports temp "104" at home. Took tylenol pta. Her son has been sick.

## 2024-04-10 ENCOUNTER — Emergency Department (HOSPITAL_BASED_OUTPATIENT_CLINIC_OR_DEPARTMENT_OTHER): Admitting: Radiology

## 2024-04-10 ENCOUNTER — Emergency Department (HOSPITAL_BASED_OUTPATIENT_CLINIC_OR_DEPARTMENT_OTHER)
Admission: EM | Admit: 2024-04-10 | Discharge: 2024-04-11 | Disposition: A | Discharge status: Home / Self Care | Attending: Emergency Medicine | Admitting: Emergency Medicine

## 2024-04-10 ENCOUNTER — Encounter (HOSPITAL_BASED_OUTPATIENT_CLINIC_OR_DEPARTMENT_OTHER): Payer: Self-pay | Admitting: Emergency Medicine

## 2024-04-10 ENCOUNTER — Emergency Department (HOSPITAL_BASED_OUTPATIENT_CLINIC_OR_DEPARTMENT_OTHER)

## 2024-04-10 DIAGNOSIS — M79671 Pain in right foot: Secondary | ICD-10-CM | POA: Insufficient documentation

## 2024-04-10 DIAGNOSIS — M25512 Pain in left shoulder: Secondary | ICD-10-CM | POA: Insufficient documentation

## 2024-04-10 DIAGNOSIS — R519 Headache, unspecified: Secondary | ICD-10-CM | POA: Insufficient documentation

## 2024-04-10 LAB — CBG MONITORING, ED: Glucose-Capillary: 136 mg/dL — ABNORMAL HIGH (ref 70–99)

## 2024-04-10 MED ORDER — KETOROLAC TROMETHAMINE 30 MG/ML IJ SOLN
30.0000 mg | Freq: Once | INTRAMUSCULAR | Status: AC
  Administered 2024-04-11: 30 mg via INTRAVENOUS
  Filled 2024-04-10: qty 1

## 2024-04-10 MED ORDER — METOCLOPRAMIDE HCL 5 MG/ML IJ SOLN
10.0000 mg | Freq: Once | INTRAMUSCULAR | Status: AC
  Administered 2024-04-11: 10 mg via INTRAVENOUS
  Filled 2024-04-10: qty 2

## 2024-04-10 NOTE — ED Triage Notes (Signed)
 C/o intermittent left arm tingling x 2 days. Most recent episode started around 0800 this morning. Endorses "severe headache" x 2 days as well. No weakness, just tingling.   Also has R foot pain starting today,

## 2024-04-10 NOTE — ED Notes (Addendum)
 Rn requested provider in triage, both providers (Josh PA and Lawsing MD) stated that based on symptoms they were not needed in triage and no need to activate stroke workup.

## 2024-04-10 NOTE — ED Provider Notes (Signed)
 [name] AT [facility] Provider Note   CSN: [id number] Arrival date & time: 04/10/24  1827     History  Chief Complaint  Patient presents with   Tingling    Kristen Gonzalez is a 48 y.o. female.  Patient is a 47 year old female with no significant past medical history.  Patient presenting today for evaluation of headache, left shoulder pain, and right foot pain.  She describes a 4-day history of a headache that she cannot get to go away.  She has been taking over-the-counter medications with little relief.  She describes some blurry vision, but no nausea or vomiting.  She also complains of pain in her left shoulder radiating down her left arm.  This pain has been present for the past 2 days.  This is worse when she raises her arm.  She describes some tingling of her hand, but no weakness.  Patient also states that she has having pain in her right foot for the past 2 days.  It is difficult for her to bear weight secondary to this discomfort.  No injury or trauma.  No alleviating factors.       Home Medications Prior to Admission medications   Medication Sig Start Date End Date Taking? Authorizing Provider  IBUPROFEN  PO Take by mouth. Patient not taking: Reported on 02/12/2024    [provider]  pantoprazole (PROTONIX) 40 MG tablet Take 40 mg by mouth every morning.    [provider]  promethazine -dextromethorphan (PROMETHAZINE -DM) 6.25-15 MG/5ML syrup Take 5 mLs by mouth 4 (four) times daily as needed for cough. Patient not taking: Reported on 02/12/2024 04/07/23   Afton Albright, MD      Allergies    Patient has no known allergies.    Review of Systems   Review of Systems  All other systems reviewed and are negative.   Physical Exam Updated Vital Signs BP (!) 146/95 (BP Location: Right Arm)   Pulse 81   Temp 98 F (36.7 C) (Oral)   Resp 20   SpO2 99%  Physical Exam Vitals and nursing note reviewed.   Constitutional:      General: She is not in acute distress.    Appearance: She is well-developed. She is not diaphoretic.  HENT:     Head: Normocephalic and atraumatic.  Eyes:     Extraocular Movements: Extraocular movements intact.     Pupils: Pupils are equal, round, and reactive to light.  Cardiovascular:     Rate and Rhythm: Normal rate and regular rhythm.     Heart sounds: No murmur heard.    No friction rub. No gallop.  Pulmonary:     Effort: Pulmonary effort is normal. No respiratory distress.     Breath sounds: Normal breath sounds. No wheezing.  Abdominal:     General: Bowel sounds are normal. There is no distension.     Palpations: Abdomen is soft.     Tenderness: There is no abdominal tenderness.  Musculoskeletal:        General: Normal range of motion.     Cervical back: Normal range of motion and neck supple.     Comments: The left shoulder appears grossly normal.  There is tenderness to palpation over the lateral aspect of the deltoid and proximal bicep.  This pain is worse when she is lifts her shoulder.  Ulnar and radial pulses are easily palpable and motor and sensation are intact throughout the entire hand.  The right foot is grossly  normal in appearance.  There is tenderness over the medial aspect of the foot, but no redness, swelling, or other abnormality.  Skin:    General: Skin is warm and dry.  Neurological:     General: No focal deficit present.     Mental Status: She is alert and oriented to person, place, and time.     Cranial Nerves: No cranial nerve deficit.     Motor: No weakness.     ED Results / Procedures / Treatments   Labs (all labs ordered are listed, but only abnormal results are displayed) Labs Reviewed  CBG MONITORING, ED - Abnormal; Notable for the following components:      Result Value   Glucose-Capillary 136 (*)    All other components within normal limits  BASIC METABOLIC PANEL WITH GFR  CBC WITH DIFFERENTIAL/PLATELET     EKG None  Radiology No results found.  Procedures Procedures    Medications Ordered in ED Medications  ketorolac  (TORADOL ) 30 MG/ML injection 30 mg (has no administration in time range)  metoCLOPramide  (REGLAN ) injection 10 mg (has no administration in time range)    ED Course/ Medical Decision Making/ A&P  Patient presenting with multiple complaints as described in the HPI.  She arrives with stable vital signs and is afebrile.  Physical examination reveals tenderness in the right foot and left shoulder, but is otherwise unremarkable.  She is neurologically intact.  Laboratory studies obtained including CBC and basic metabolic panel, both of which are unremarkable.  CT scan of the head showing no acute process.  X-ray of the left shoulder and right foot are negative.  Patient given IV Toradol  and Reglan  for her headache and seems to be feeling better.  She is now resting comfortably.  Cause of the headache unclear, but nothing appears emergent.  I suspect some sort of migraine phenomenon.  The pain in her shoulder and foot seem unrelated and separate conditions.  I will advise her to take ibuprofen  for these issues and return as needed for any problems.  Final Clinical Impression(s) / ED Diagnoses Final diagnoses:  None    Rx / DC Orders ED Discharge Orders     None         Orvilla Blander, MD 04/11/24 [phone]

## 2024-04-11 LAB — CBC WITH DIFFERENTIAL/PLATELET
Abs Immature Granulocytes: 0.03 10*3/uL (ref 0.00–0.07)
Basophils Absolute: 0.1 10*3/uL (ref 0.0–0.1)
Basophils Relative: 1 %
Eosinophils Absolute: 0.5 10*3/uL (ref 0.0–0.5)
Eosinophils Relative: 4 %
HCT: 35.1 % — ABNORMAL LOW (ref 36.0–46.0)
Hemoglobin: 11.3 g/dL — ABNORMAL LOW (ref 12.0–15.0)
Immature Granulocytes: 0 %
Lymphocytes Relative: 33 %
Lymphs Abs: 4.1 10*3/uL — ABNORMAL HIGH (ref 0.7–4.0)
MCH: 27.6 pg (ref 26.0–34.0)
MCHC: 32.2 g/dL (ref 30.0–36.0)
MCV: 85.6 fL (ref 80.0–100.0)
Monocytes Absolute: 1 10*3/uL (ref 0.1–1.0)
Monocytes Relative: 8 %
Neutro Abs: 6.6 10*3/uL (ref 1.7–7.7)
Neutrophils Relative %: 54 %
Platelets: 269 10*3/uL (ref 150–400)
RBC: 4.1 MIL/uL (ref 3.87–5.11)
RDW: 15.9 % — ABNORMAL HIGH (ref 11.5–15.5)
WBC: 12.2 10*3/uL — ABNORMAL HIGH (ref 4.0–10.5)
nRBC: 0 % (ref 0.0–0.2)

## 2024-04-11 LAB — BASIC METABOLIC PANEL WITH GFR
Anion gap: 10 (ref 5–15)
BUN: 9 mg/dL (ref 6–20)
CO2: 24 mmol/L (ref 22–32)
Calcium: 9 mg/dL (ref 8.9–10.3)
Chloride: 103 mmol/L (ref 98–111)
Creatinine, Ser: 0.95 mg/dL (ref 0.44–1.00)
GFR, Estimated: >60 mL/min (ref >60)
Glucose, Bld: 98 mg/dL (ref 70–99)
Potassium: 3.8 mmol/L (ref 3.5–5.1)
Sodium: 138 mmol/L (ref 135–145)

## 2024-04-11 NOTE — Discharge Instructions (Signed)
 Begin taking ibuprofen  600 mg every 6 hours as needed for pain.  Rest.  Follow-up with primary doctor if not improving in the next week.
